# Patient Record
Sex: Male | Born: 1945 | Race: Black or African American | Hispanic: No | Marital: Married
Health system: Midwestern US, Community
[De-identification: ages and names within clinical notes are randomized; demographics above are authoritative.]

## PROBLEM LIST (undated history)

## (undated) DIAGNOSIS — K5730 Diverticulosis of large intestine without perforation or abscess without bleeding: Secondary | ICD-10-CM

## (undated) DIAGNOSIS — I4891 Unspecified atrial fibrillation: Secondary | ICD-10-CM

## (undated) DIAGNOSIS — R109 Unspecified abdominal pain: Secondary | ICD-10-CM

## (undated) DIAGNOSIS — R001 Bradycardia, unspecified: Secondary | ICD-10-CM

---

## 2009-07-26 NOTE — Progress Notes (Signed)
[facility] Vascular Lab Technologist Preliminary Report:  PVR Lower Extremity with Exercise    Pressure (mmHg) Right    Left    Brachial:  170   165  High Thigh:  >250   >250  Low Thigh:  224   220  Calf:   227   220  Ankle DPA:  193   180  Ankle PTA:  194   195  Great Toe:  167   151    Waveform:  Right   Left  CFA:   Triphasic  Triphasic  Popliteal:  Triphasic  Triphasic  PTA:   Triphasic  Triphasic  DPA:   Triphasic  Triphasic  Great Toe:  Pulsatile  Pulsatile    Right Resting ABI:   1.14   Right Post Exercise ABI: 1.23  Right Resting TBI:  0.98    Left Resting ABI:  1.15   Left Post Exercise ABI: 1.26  Left Resting TBI:  0.89    Final report to follow.    CFA = Common Femoral Artery  DPA = Dorsalis Pedis Artery  PTA = Posterior Tibial Artery  ABI = Ankle Brachial Index  TBI = Toe Brachial Index

## 2017-09-25 ENCOUNTER — Encounter

## 2017-10-02 ENCOUNTER — Inpatient Hospital Stay: Admit: 2017-10-02 | Payer: MEDICARE

## 2017-10-02 ENCOUNTER — Ambulatory Visit

## 2017-10-02 DIAGNOSIS — R109 Unspecified abdominal pain: Secondary | ICD-10-CM

## 2017-10-02 MED ORDER — SODIUM CHLORIDE 0.9 % IJ SYRG
Freq: Once | INTRAMUSCULAR | Status: AC
Start: 2017-10-02 — End: 2017-10-02
  Administered 2017-10-02: 19:00:00 via INTRAVENOUS

## 2017-10-02 MED ORDER — IOHEXOL 240 MG/ML IV SOLN
240 mg iodine/mL | Freq: Once | INTRAVENOUS | Status: AC
Start: 2017-10-02 — End: 2017-10-02
  Administered 2017-10-02: 19:00:00 via ORAL

## 2017-10-02 MED ORDER — IOPAMIDOL 76 % IV SOLN
370 mg iodine /mL (76 %) | Freq: Once | INTRAVENOUS | Status: AC
Start: 2017-10-02 — End: 2017-10-02
  Administered 2017-10-02: 19:00:00 via INTRAVENOUS

## 2017-10-02 MED FILL — ISOVUE-370  76 % INTRAVENOUS SOLUTION: 370 mg iodine /mL (76 %) | INTRAVENOUS | Qty: 100

## 2017-10-02 MED FILL — OMNIPAQUE 240 MG IODINE/ML INTRAVENOUS SOLUTION: 240 mg iodine/mL | INTRAVENOUS | Qty: 50

## 2017-10-03 ENCOUNTER — Encounter

## 2017-10-07 ENCOUNTER — Inpatient Hospital Stay: Payer: MEDICARE | Attending: Cardiovascular Disease

## 2017-10-07 DIAGNOSIS — I4891 Unspecified atrial fibrillation: Secondary | ICD-10-CM

## 2017-10-07 MED ORDER — METOPROLOL TARTRATE 25 MG TAB
25 mg | ORAL_TABLET | Freq: Two times a day (BID) | ORAL | 11 refills | Status: AC
Start: 2017-10-07 — End: ?

## 2017-10-07 NOTE — Progress Notes (Signed)
Patient arrived to Non-Invasive Cardiology Lab for Out Patient TEE and Cardioversion Procedure. Staff introduced to patient. Patient identifiers verified with Name and Date of Birth. Procedure verified with patient. Consent forms reviewed and signed by patient or authorized representative and verified. Allergies verified. Patient informed of procedure and plan of care. Questions answered with review.     Patient on cardiac monitor, in Sinus tachycardia, non-invasive blood pressure, SPO2 monitor. On room air. Patient is A&Ox3. Patient reports no complaints.    EKG obtained    Patient on stretcher, in low position, with side rails up. Patient instructed to call for assistance as needed.    Dr Shirlyn Goltz called to room to assess Rhythm    Family in waiting room.

## 2017-10-07 NOTE — Progress Notes (Signed)
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    Event monitor to be mailed to pt, pt to follow up with Dr Shirlyn Goltz in 7-8 weeks, office to contact pt to schedule. New prescription for Metoprolol sent to pts pharmacy per Dr Shirlyn Goltz

## 2017-10-08 LAB — EKG 12-LEAD
Atrial Rate: 104 {beats}/min
P Axis: -60 degrees
P-R Interval: 280 ms
Q-T Interval: 338 ms
QRS Duration: 72 ms
QTc Calculation (Bazett): 444 ms
R Axis: 57 degrees
T Axis: 34 degrees
Ventricular Rate: 104 {beats}/min

## 2017-10-08 LAB — EKG, 12 LEAD, INITIAL
Atrial Rate: 104 {beats}/min
Calculated P Axis: -60 degrees
Calculated R Axis: 57 degrees
Calculated T Axis: 34 degrees
P-R Interval: 280 ms
Q-T Interval: 338 ms
QRS Duration: 72 ms
QTC Calculation (Bezet): 444 ms
Ventricular Rate: 104 {beats}/min

## 2017-12-12 ENCOUNTER — Inpatient Hospital Stay: Payer: MEDICARE

## 2017-12-12 ENCOUNTER — Ambulatory Visit: Admit: 2017-12-12 | Payer: MEDICARE

## 2017-12-12 MED ORDER — FENTANYL CITRATE (PF) 50 MCG/ML IJ SOLN
50 mcg/mL | INTRAMUSCULAR | Status: DC | PRN
Start: 2017-12-12 — End: 2017-12-12
  Administered 2017-12-12 (×2): via INTRAVENOUS

## 2017-12-12 MED ORDER — MIDAZOLAM (PF) 1 MG/ML INJECTION SOLUTION
1 mg/mL | INTRAMUSCULAR | Status: AC
Start: 2017-12-12 — End: ?

## 2017-12-12 MED ORDER — BACITRACIN 50,000 UNIT IM
50000 unit | INTRAMUSCULAR | Status: DC | PRN
Start: 2017-12-12 — End: 2017-12-12
  Administered 2017-12-12: 15:00:00

## 2017-12-12 MED ORDER — CEPHALEXIN 500 MG CAP
500 mg | ORAL_CAPSULE | Freq: Four times a day (QID) | ORAL | 0 refills | Status: AC
Start: 2017-12-12 — End: 2017-12-15

## 2017-12-12 MED ORDER — MIDAZOLAM 1 MG/ML IJ SOLN
1 mg/mL | INTRAMUSCULAR | Status: DC | PRN
Start: 2017-12-12 — End: 2017-12-12
  Administered 2017-12-12 (×3): via INTRAVENOUS

## 2017-12-12 MED ORDER — CEFAZOLIN 1 GRAM SOLUTION FOR INJECTION
1 gram | INTRAMUSCULAR | Status: AC
Start: 2017-12-12 — End: ?

## 2017-12-12 MED ORDER — LIDOCAINE-EPINEPHRINE 1 %-1:100,000 IJ SOLN
1 %-:00,000 | INTRAMUSCULAR | Status: DC | PRN
Start: 2017-12-12 — End: 2017-12-12
  Administered 2017-12-12: 15:00:00 via INTRADERMAL

## 2017-12-12 MED ORDER — CEFAZOLIN 2 GRAM/20 ML IN STERILE WATER INTRAVENOUS SYRINGE
2 gram/0 mL | INTRAVENOUS | Status: AC
Start: 2017-12-12 — End: ?

## 2017-12-12 MED ORDER — FENTANYL CITRATE (PF) 50 MCG/ML IJ SOLN
50 mcg/mL | INTRAMUSCULAR | Status: AC
Start: 2017-12-12 — End: ?

## 2017-12-12 MED ORDER — IOPAMIDOL 76 % IV SOLN
370 mg iodine /mL (76 %) | INTRAVENOUS | Status: DC | PRN
Start: 2017-12-12 — End: 2017-12-12
  Administered 2017-12-12: 15:00:00 via INTRAVENOUS

## 2017-12-12 MED ORDER — CEFAZOLIN 2 GRAM/20 ML IN STERILE WATER INTRAVENOUS SYRINGE
2 gram/0 mL | INTRAVENOUS | Status: DC | PRN
Start: 2017-12-12 — End: 2017-12-12
  Administered 2017-12-12: 15:00:00 via INTRAVENOUS

## 2017-12-12 MED ORDER — HEPARIN (PORCINE) IN NS (PF) 1,000 UNIT/500 ML IV
1000 unit/500 mL | INTRAVENOUS | Status: AC
Start: 2017-12-12 — End: ?

## 2017-12-12 MED ORDER — MIDAZOLAM 1 MG/ML IJ SOLN
1 mg/mL | INTRAMUSCULAR | Status: DC | PRN
Start: 2017-12-12 — End: 2017-12-12
  Administered 2017-12-12: 15:00:00 via INTRAVENOUS

## 2017-12-12 MED ORDER — LIDOCAINE-EPINEPHRINE 1 %-1:100,000 IJ SOLN
1 %-:00,000 | INTRAMUSCULAR | Status: AC
Start: 2017-12-12 — End: ?

## 2017-12-12 MED ORDER — HEPARIN (PORCINE) IN NS (PF) 1,000 UNIT/500 ML IV
1000 unit/500 mL | INTRAVENOUS | Status: AC | PRN
Start: 2017-12-12 — End: 2017-12-12
  Administered 2017-12-12: 15:00:00

## 2017-12-12 MED ORDER — BACITRACIN 50,000 UNIT IM
50000 unit | INTRAMUSCULAR | Status: AC
Start: 2017-12-12 — End: ?

## 2017-12-12 MED ORDER — MIDAZOLAM 1 MG/ML IJ SOLN
1 mg/mL | INTRAMUSCULAR | Status: AC
Start: 2017-12-12 — End: ?

## 2017-12-12 MED FILL — BACITRACIN 50,000 UNIT IM: 50000 unit | INTRAMUSCULAR | Qty: 50000

## 2017-12-12 MED FILL — FENTANYL CITRATE (PF) 50 MCG/ML IJ SOLN: 50 mcg/mL | INTRAMUSCULAR | Qty: 2

## 2017-12-12 MED FILL — XYLOCAINE WITH EPINEPHRINE 1 %-1:100,000 INJECTION SOLUTION: 1 %-:00,000 | INTRAMUSCULAR | Qty: 40

## 2017-12-12 MED FILL — HEPARIN (PORCINE) IN NS (PF) 1,000 UNIT/500 ML IV: 1000 unit/500 mL | INTRAVENOUS | Qty: 500

## 2017-12-12 MED FILL — MIDAZOLAM (PF) 1 MG/ML INJECTION SOLUTION: 1 mg/mL | INTRAMUSCULAR | Qty: 2

## 2017-12-12 MED FILL — CEFAZOLIN 1 GRAM SOLUTION FOR INJECTION: 1 gram | INTRAMUSCULAR | Qty: 1000

## 2017-12-12 MED FILL — CEFAZOLIN 2 GRAM/20 ML IN STERILE WATER INTRAVENOUS SYRINGE: 2 gram/0 mL | INTRAVENOUS | Qty: 20

## 2017-12-12 MED FILL — MIDAZOLAM 1 MG/ML IJ SOLN: 1 mg/mL | INTRAMUSCULAR | Qty: 5

## 2017-12-12 NOTE — Progress Notes (Signed)
 TRANSFER - IN REPORT:    Verbal report received from [facility]) on [employer].  being received from Cath lab recovery(unit) for routine progression of care      Report consisted of patient's Situation, Background, Assessment and   Recommendations(SBAR).     Information from the following report(s) SBAR and Kardex was reviewed with the receiving nurse.    Opportunity for questions and clarification was provided.      Assessment completed upon patient's arrival to unit and care assumed.

## 2017-12-12 NOTE — Progress Notes (Signed)
Stable post pacer. Will d/c home. D/C on prior meds plus metoprolol ,  Keflex    See us 2 weeks for pacer check     Resume Eliquis Sunday

## 2017-12-12 NOTE — Progress Notes (Signed)
TRANSFER - IN REPORT:    Verbal report received from Osker Mason, RN on [employer].  being received from EP Lab for routine progression of care. Report consisted of patient's Situation, Background, Assessment and Recommendations(SBAR). Information from the following report(s) SBAR, Kardex, Procedure Summary, MAR and Recent Results was reviewed with the receiving clinician. Opportunity for questions and clarification was provided. Assessment completed upon patient's arrival to Cardiac Cath Lab RECOVERY AREA and care assumed.       Cardiac Cath Lab Recovery Arrival Note:    [name] [name]. arrived to CCL recovery area.  Patient procedure= PPI. Patient on cardiac monitor, non-invasive blood pressure, SPO2 monitor. On ROOM AIRPatient status doing well without problems. Patient is A&Ox 4. Patient reports no complaints.    PROCEDURE SITE CHECK:    Procedure site:without any bleeding and tefla tegaderm dressing, No pain/discomfort reported at procedure site.     No change in patient status. Continue to monitor patient and status.

## 2017-12-12 NOTE — Progress Notes (Signed)
TRANSFER - IN REPORT:    Verbal report received from Barbara Covington, RN on [name] Jungman Jr.  being received from EP Lab for routine progression of care. Report consisted of patient???s Situation, Background, Assessment and Recommendations(SBAR). Information from the following report(s) SBAR, Kardex, Procedure Summary, MAR and Recent Results was reviewed with the receiving clinician. Opportunity for questions and clarification was provided. Assessment completed upon patient???s arrival to Cardiac Cath Lab RECOVERY AREA and care assumed.       Cardiac Cath Lab Recovery Arrival Note:    [name] Twombly Jr. arrived to CCL recovery area.  Patient procedure= PPI. Patient on cardiac monitor, non-invasive blood pressure, SPO2 monitor. On ROOM AIRPatient status doing well without problems. Patient is A&Ox 4. Patient reports no complaints.    PROCEDURE SITE CHECK:    Procedure site:without any bleeding and tefla tegaderm dressing, No pain/discomfort reported at procedure site.     No change in patient status. Continue to monitor patient and status.

## 2017-12-12 NOTE — Progress Notes (Signed)
Spoke with Dr. Holdaway regarding patient's discharge and he states he will be at the office for 45 mins to an hour longer; MD states to transfer patient to IVCU until discharge. Report given to Terry RN and patient ambulated to room 2153.

## 2017-12-12 NOTE — Progress Notes (Signed)
Discharge instructions reviewed with patient and spouse. Allowed adequate time to ask questions, all questions answered. Printed copy of AVS given to patient. All belongings gathered, IV and tele discontinued. Transported via wheelchair to main entrance and into care of family.

## 2017-12-12 NOTE — Progress Notes (Signed)
TRANSFER - IN REPORT:    Verbal report received from Susan RN(name) on Foch Hatcher Jr.  being received from Cath lab recovery(unit) for routine progression of care      Report consisted of patient???s Situation, Background, Assessment and   Recommendations(SBAR).     Information from the following report(s) SBAR and Kardex was reviewed with the receiving nurse.    Opportunity for questions and clarification was provided.      Assessment completed upon patient???s arrival to unit and care assumed.

## 2017-12-12 NOTE — Progress Notes (Signed)
Cardiac Cath Lab Recovery Arrival Note:      [name] Lanagan Jr. arrived to Cardiac Cath Lab, Recovery Area. Staff introduced to patient. Patient identifiers verified with NAME and DATE OF BIRTH. Procedure verified with patient. Consent forms reviewed and signed by patient or authorized representative and verified. Allergies verified.     Patient and family oriented to department. Patient and family informed of procedure and plan of care.     Questions answered with review. Patient prepped for procedure, per orders from physician, prior to arrival.    Patient on cardiac monitor, non-invasive blood pressure, SPO2 monitor. Patient is A&Ox 4. Patient reports no complaints.     Patient in stretcher, in low position, with side rails up, call bell within reach, patient instructed to call if assistance as needed.    Patient prep in: CCL Recovery Area, [facility].       Prep by: Julie Clark, RN and Susan Fincher, RN

## 2017-12-12 NOTE — Progress Notes (Addendum)
Stable post pacer. Will d/c home. D/C on prior meds plus metoprolol ,  Keflex    See us 2 weeks for pacer check     Resume Eliquis Sunday

## 2017-12-12 NOTE — Progress Notes (Signed)
Dual chamber pacer placed via left subclavian s complications  Operator Lewie Deman    Full report to follow.

## 2017-12-12 NOTE — Progress Notes (Signed)
Spoke with Dr. Irma NewnessHoldaway regarding patient's discharge and he states he will be at the office for 45 mins to an hour longer; [facility]. Report given to [facility] RN and patient ambulated to room 2153.

## 2017-12-12 NOTE — Progress Notes (Signed)
Cardiac Cath Lab Recovery Arrival Note:      [name] [name]. arrived to Cardiac Cath Lab, Recovery Area. Staff introduced to patient. Patient identifiers verified with NAME and DATE OF BIRTH. Procedure verified with patient. Consent forms reviewed and signed by patient or authorized representative and verified. Allergies verified.     Patient and family oriented to department. Patient and family informed of procedure and plan of care.     Questions answered with review. Patient prepped for procedure, per orders from physician, prior to arrival.    Patient on cardiac monitor, non-invasive blood pressure, SPO2 monitor. Patient is A&Ox 4. Patient reports no complaints.     Patient in stretcher, in low position, with side rails up, call bell within reach, patient instructed to call if assistance as needed.    Patient prep in: CCL Recovery Area, [facility].       Prep by: Aura Dials, RN and Christen Butter, RN

## 2017-12-12 NOTE — Progress Notes (Signed)
Discharge instructions reviewed with patient and spouse. Allowed adequate time to ask questions, all questions answered. Printed copy of AVS given to patient. All belongings gathered, IV and tele discontinued. Transported via wheelchair to main entrance and into care of family.

## 2017-12-12 NOTE — Progress Notes (Signed)
Dual chamber pacer placed via left subclavian s complications  Operator Olivea Sonnen    Full report to follow.

## 2018-05-18 IMAGING — CT CT CHEST PULMONARY EMBOLISM W IV CONTRAST
2 of 5 series · 13 of 36 positions shown · non-contrast
Comparison: CT 02/17/2018

CT CHEST PULMONARY EMBOLISM W IV CONTRAST
INDICATION: PE suspected, intermediate prob, positive D-dimer
TECHNIQUE: Following informed consent IV contrast administered and axial images obtained followed by MIP reformatted images

[Series 2: pe chest · axial · 0.84mm/px · z∈[-300,-25]mm · 10 of 136 slices shown]
[im 13/136  lung]
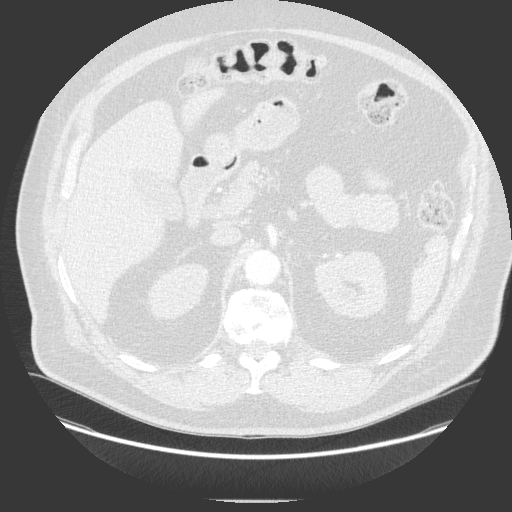
[im 25/136  mediastinal]
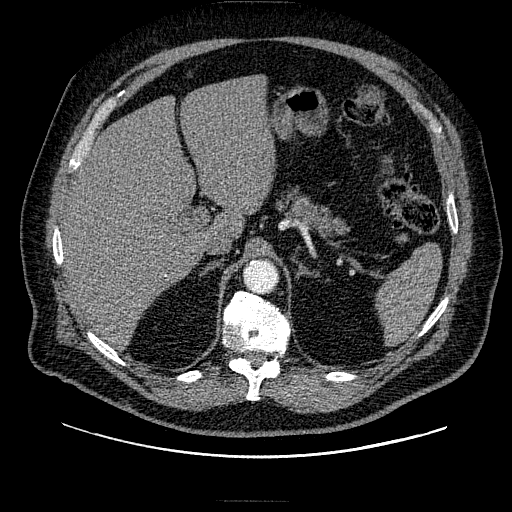
[im 37/136  lung]
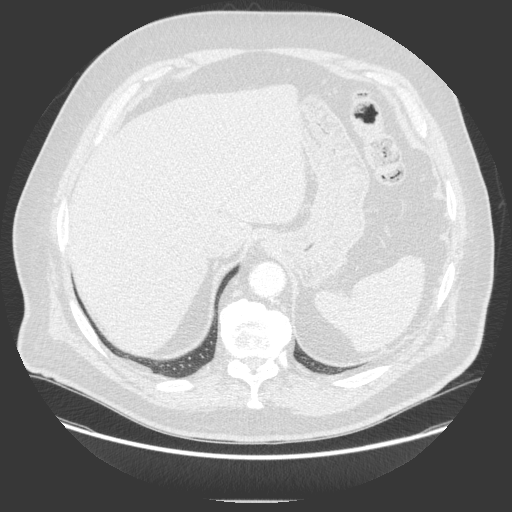
[im 50/136  mediastinal]
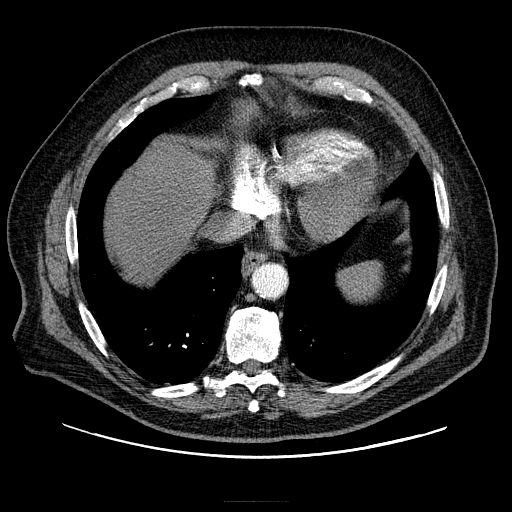
[im 62/136  lung]
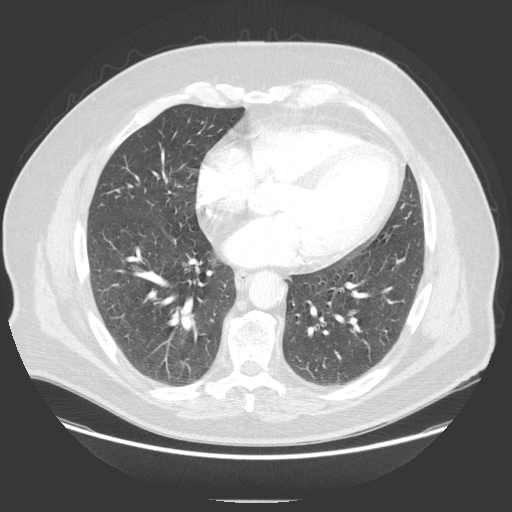
[im 74/136  mediastinal]
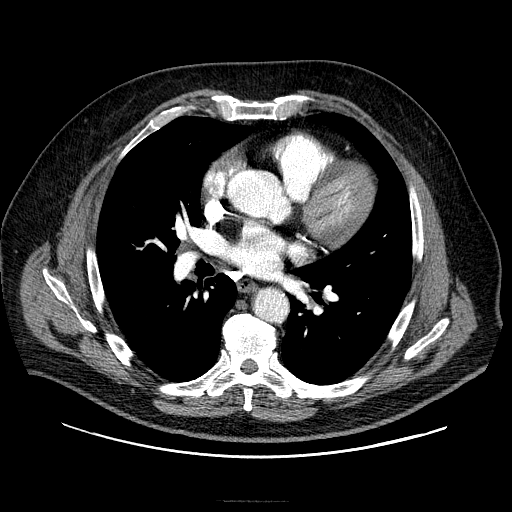
[im 86/136  lung]
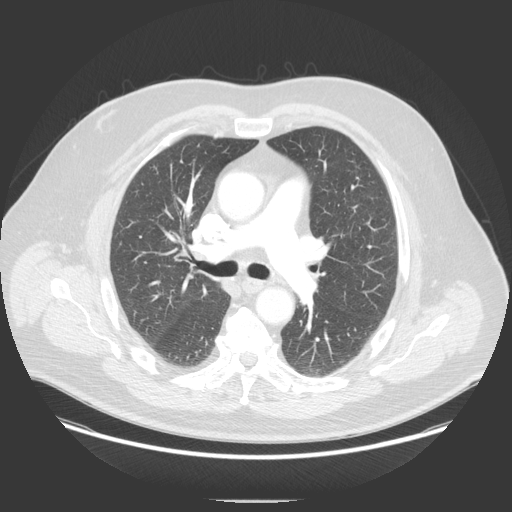
[im 99/136  mediastinal]
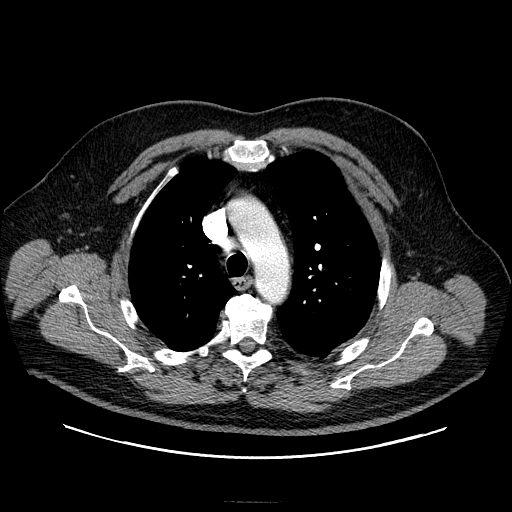
[im 111/136  lung]
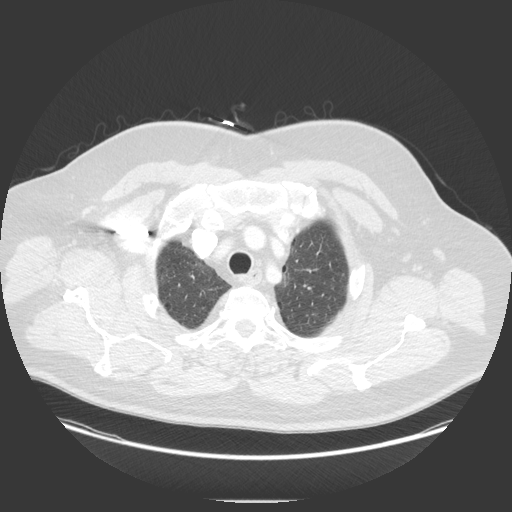
[im 123/136  mediastinal]
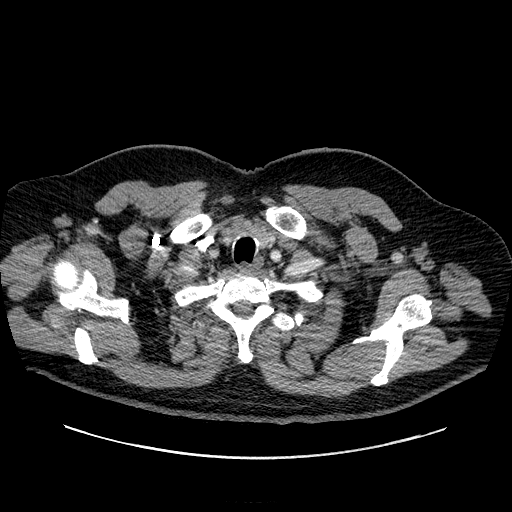

[Series 601: sag standard 2x2 · sagittal · 0.84mm/px · 3 of 215 slices shown]
[im 43/215  mediastinal]
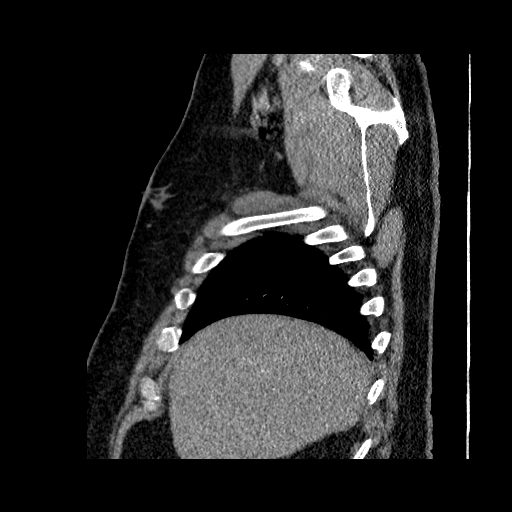
[im 86/215  mediastinal]
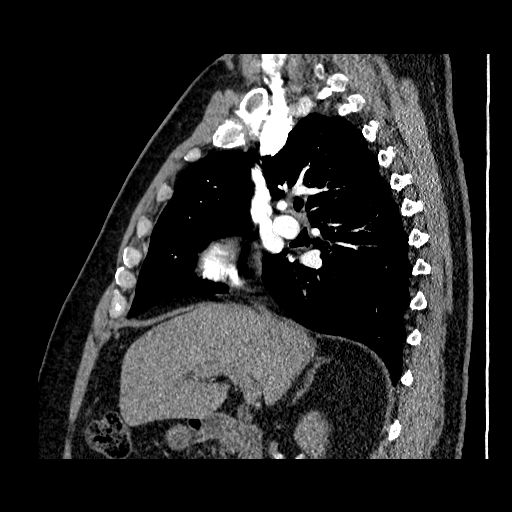
[im 129/215  mediastinal]
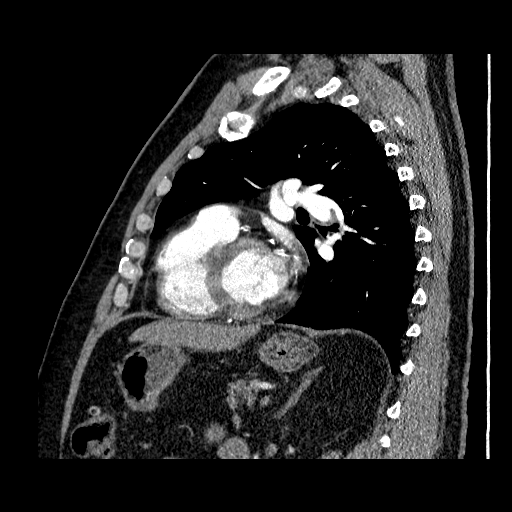

[13 of 36 positions shown; findings below may reference images not displayed]

FINDINGS: Lower neck structures in thoracic inlet are unremarkable. The anterior chest wall and axillary regions are unremarkable. Superior mediastinum demonstrates normal enhancement of the aortic arch takeoffs of the major vessels without aneurysm mass or adenopathy. Central mediastinum is unremarkable. The hilar regions demonstrates no significant mass or adenopathy. Proximal tracheobronchial tree is normal. Cardiac chambers and pericardium are normal.
The upper abdomen demonstrates no significant abnormality.
Lung windows demonstrates no infiltrate mass or pleural fluid.
Sagittal reformatted images demonstrates spondylosis midthoracic spine anteriorly without bony lytic or destructive change.
Evaluation of pulmonary arteries and MIP reformatted images demonstrates no evidence of pulmonary emboli
IMPRESSION: 
IMPRESSION: No evidence of pulmonary emboli. No acute intrathoracic abnormality
Location: 1

## 2018-05-18 IMAGING — CR XR CHEST 1 VIEW
1 series · 2 of 2 positions shown · non-contrast
Comparison: Chest x-ray 09/25/2017

XR CHEST 1 VIEW
INDICATION: chest pain
TECHNIQUE: Single view

[Series 9562: AP · 2 of 2 slices shown]
[im 1/2]
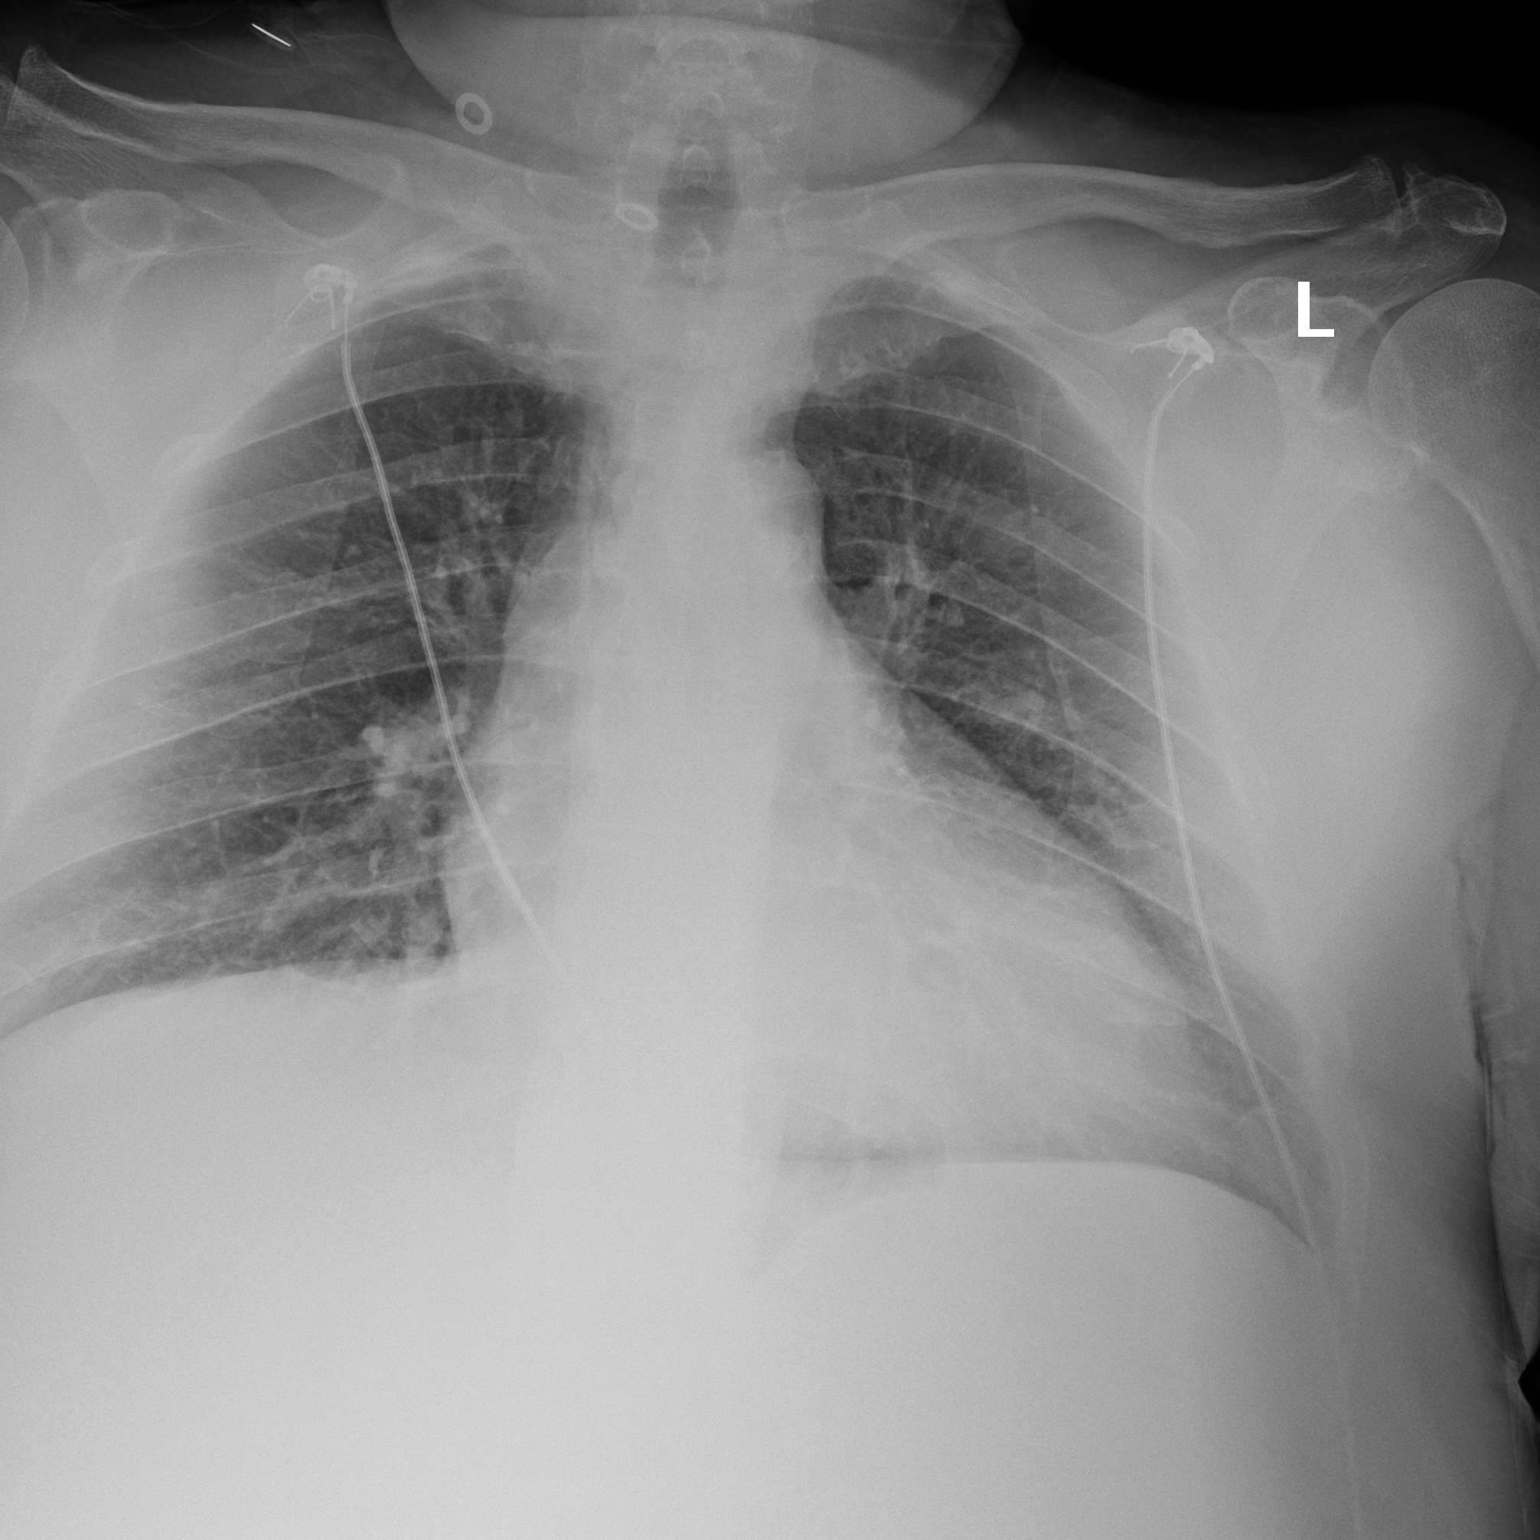
[im 2/2]
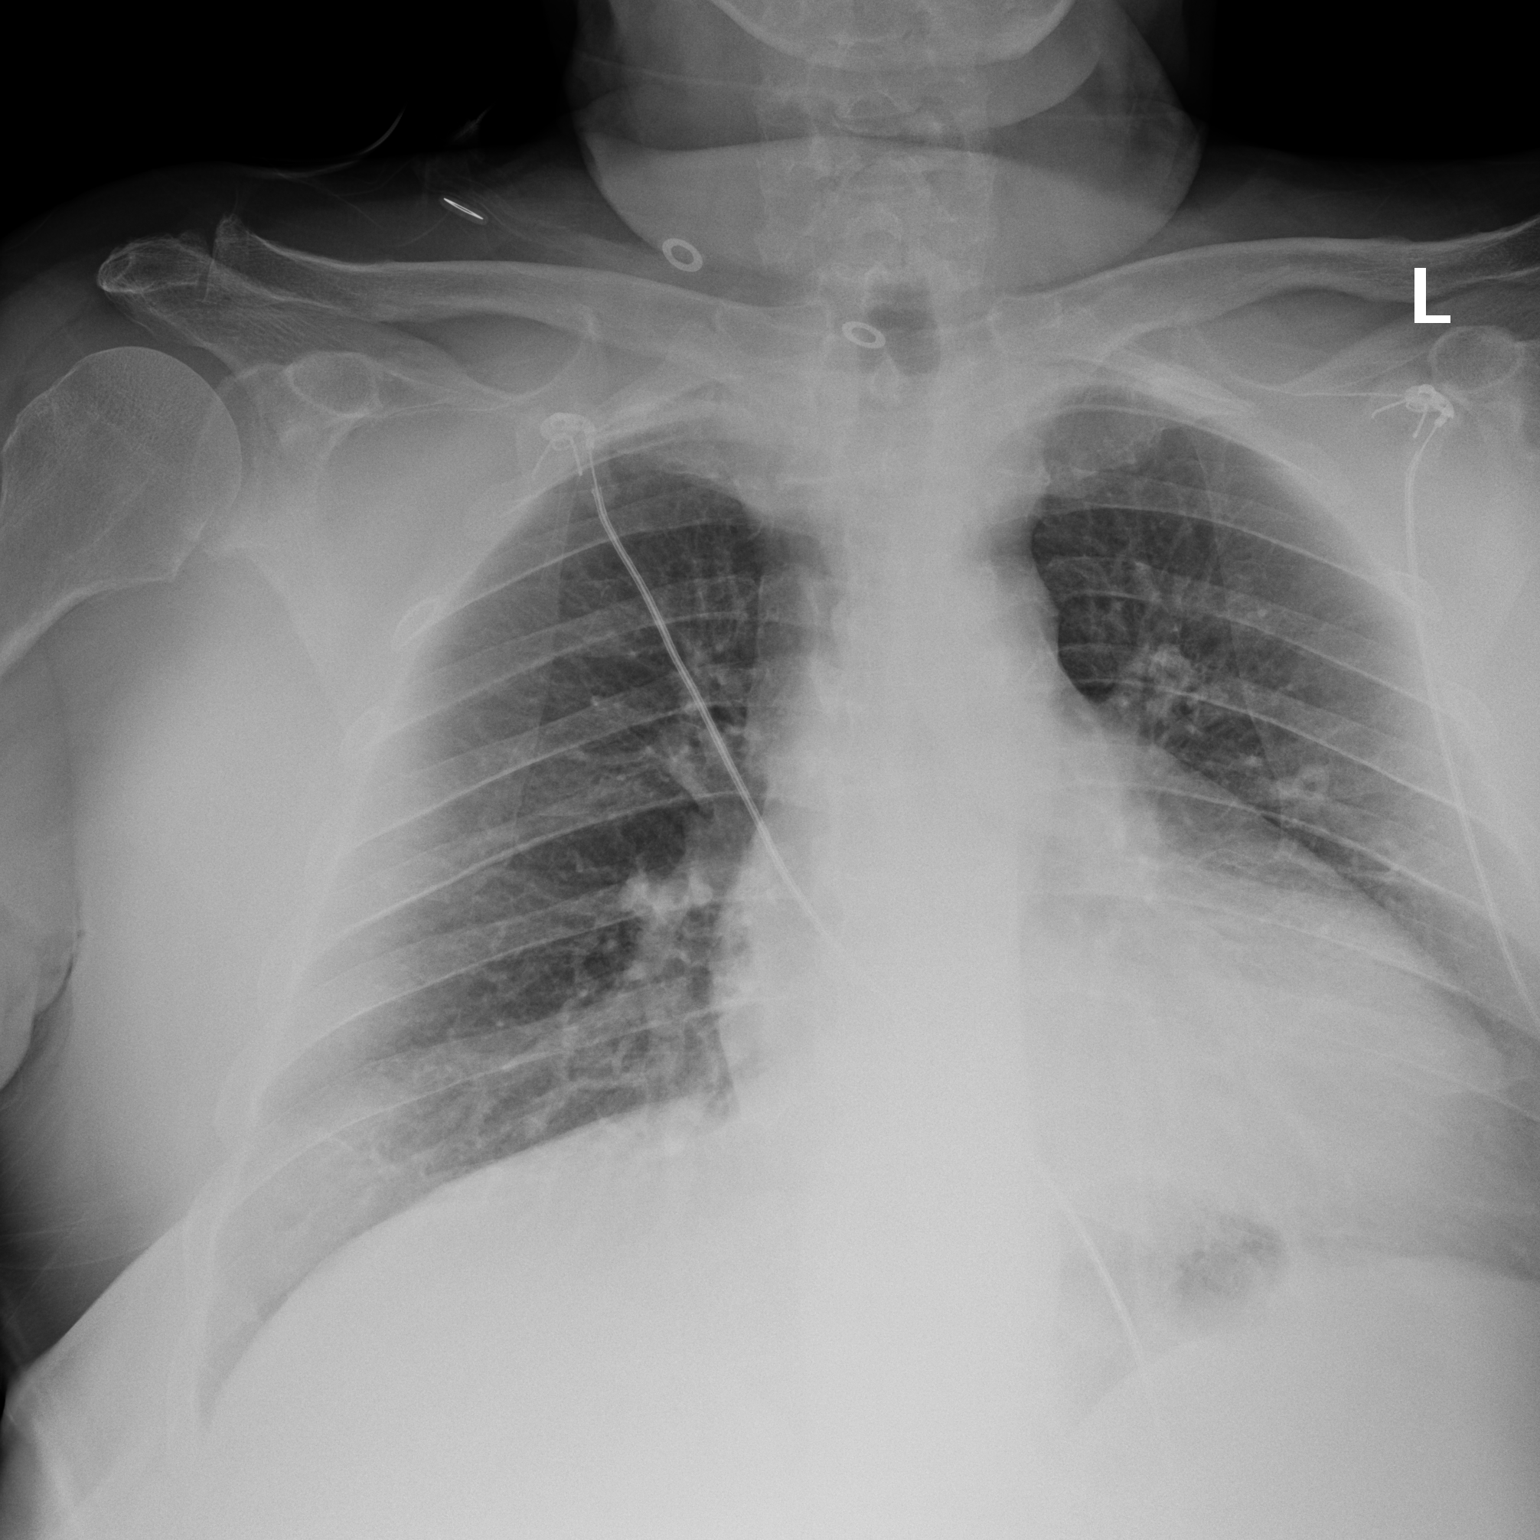

[2 of 2 positions shown; findings below may reference images not displayed]

FINDINGS: Cardiac silhouette and mediastinal structures lung fields are clear. Bony thorax unremarkable
IMPRESSION: 
IMPRESSION: No acute parenchymal disease
Location: 1

## 2018-06-04 IMAGING — CR Chest
3 series · 3 of 3 positions shown · non-contrast
Comparison: none

S.O.B AND COUGH
TWO VIEW CHEST:
CLINICAL INDICATION: cough
REFERENCE:

[PA]
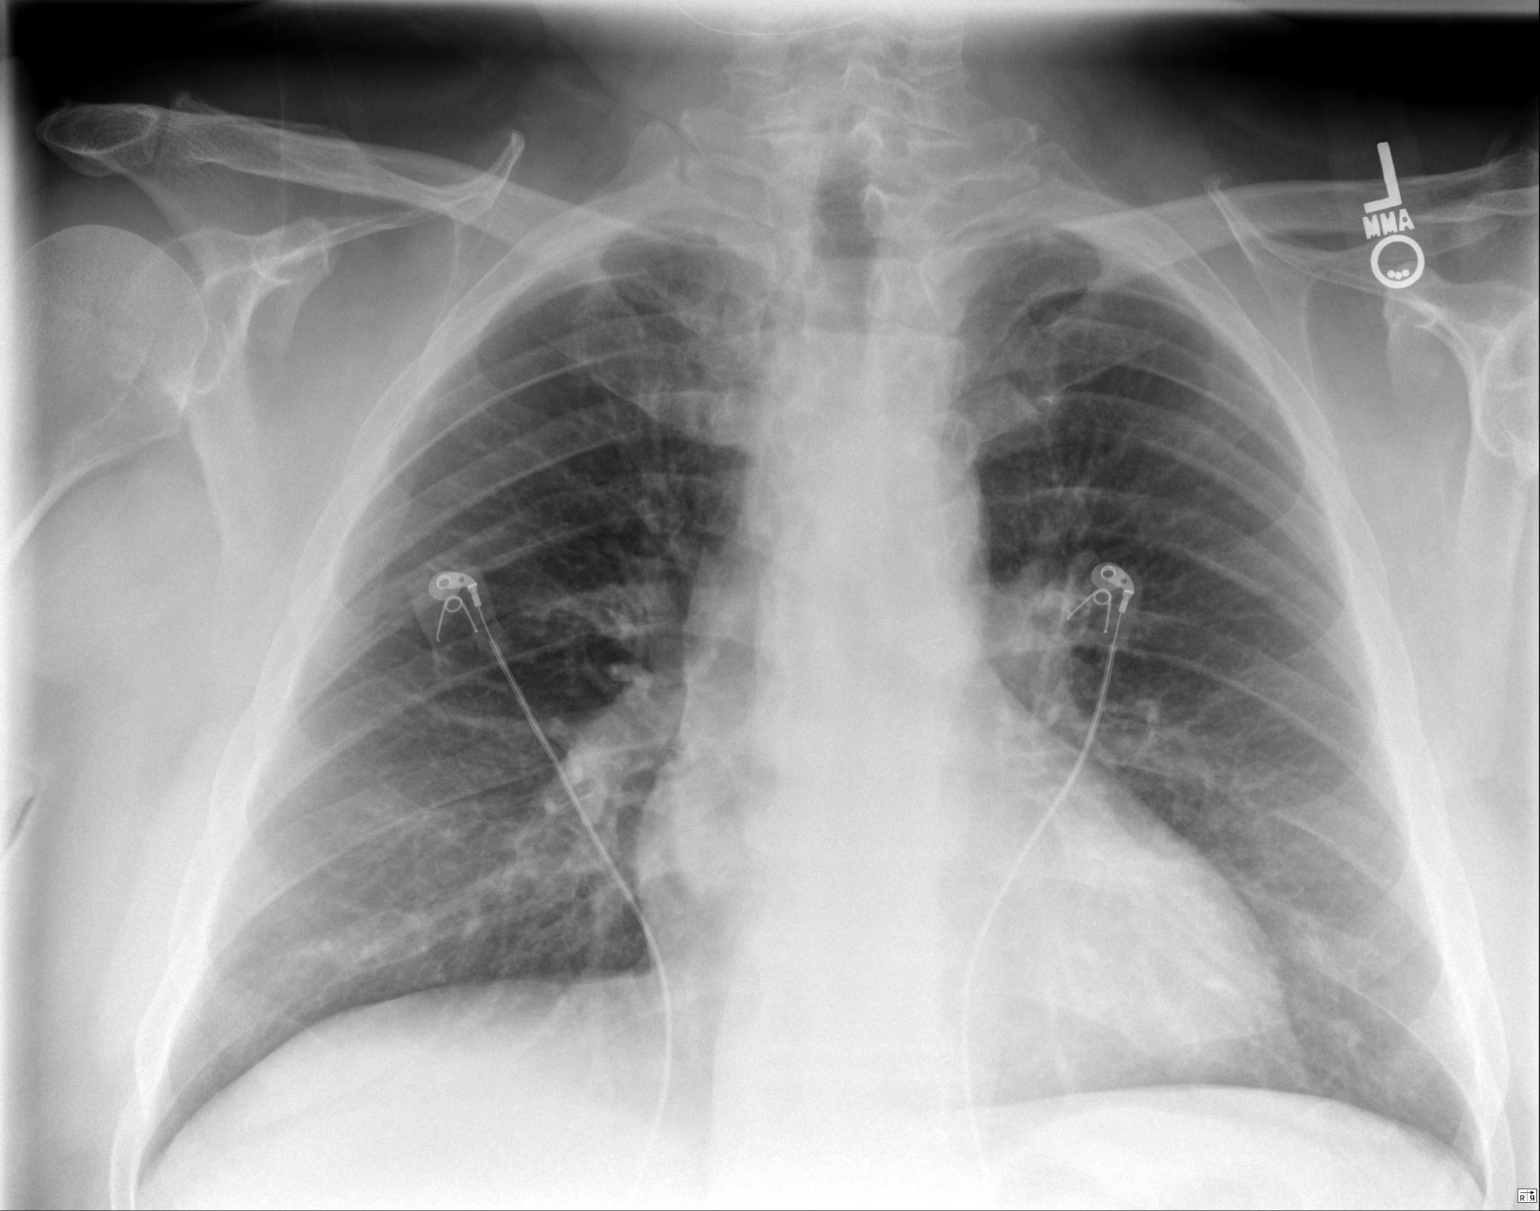

[left lateral (1 of 2)]
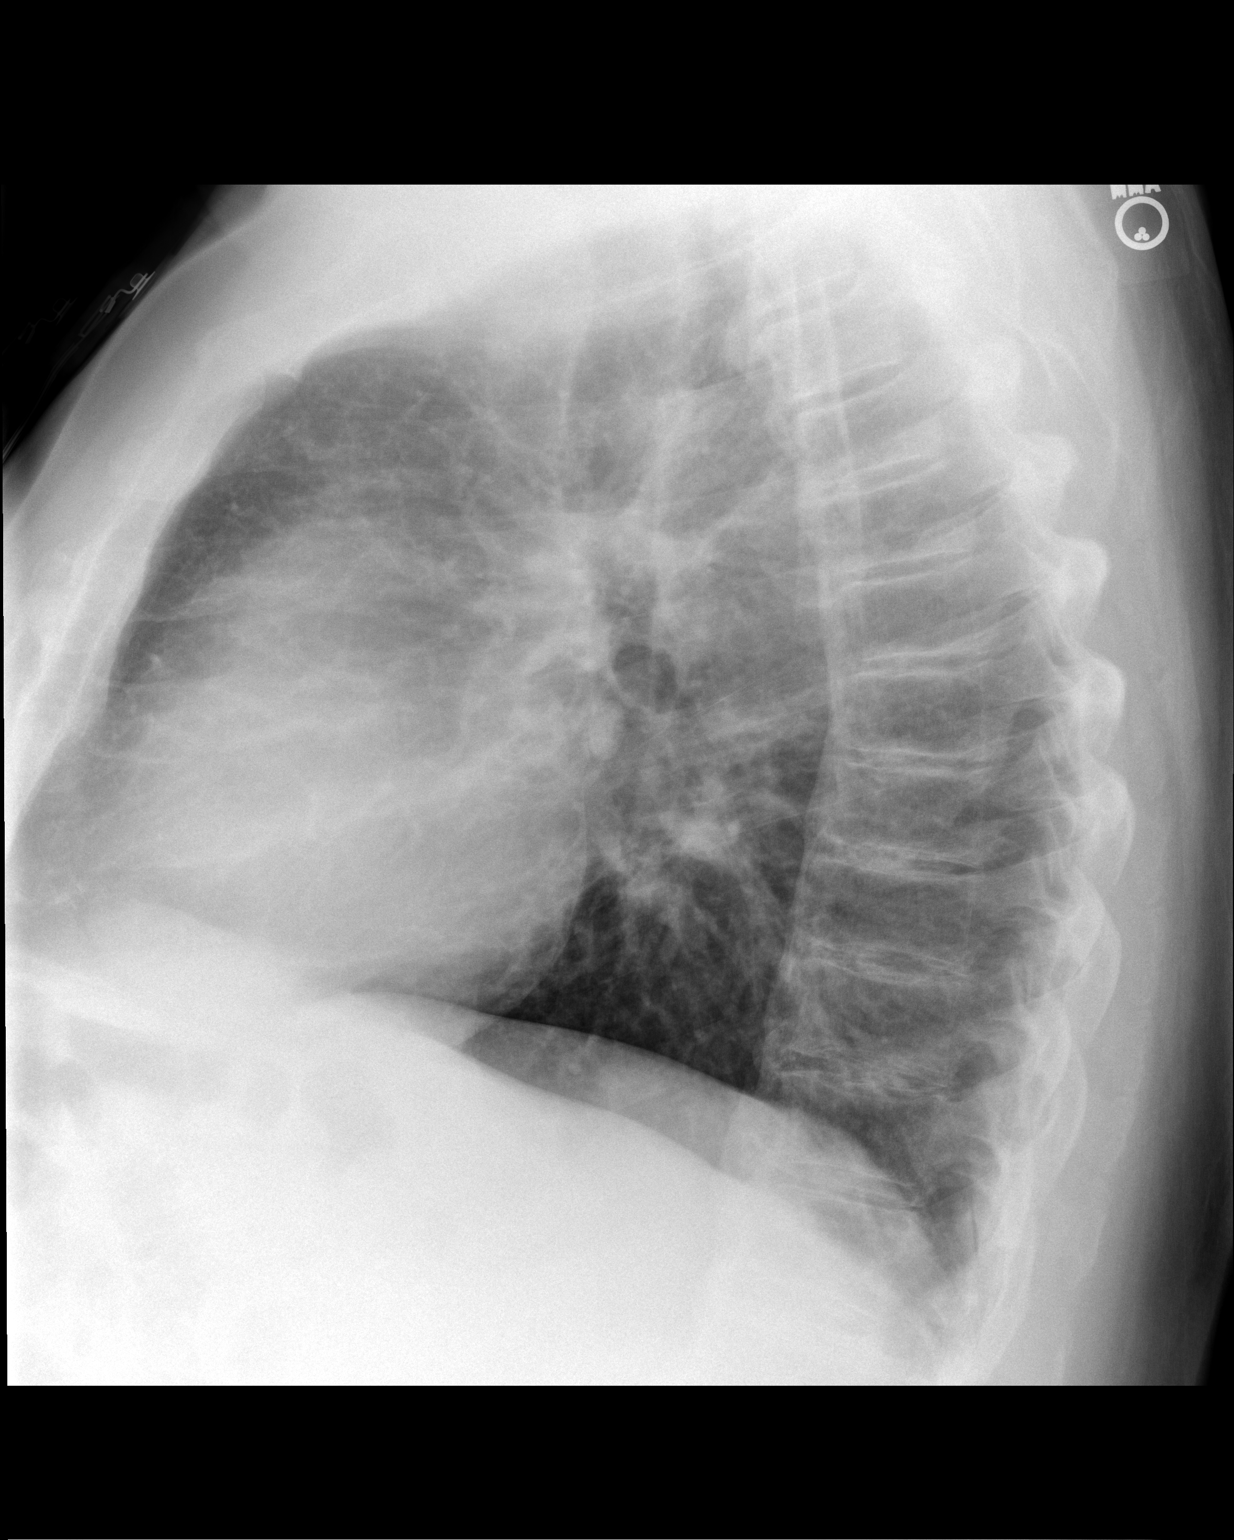

[left lateral (2 of 2)]
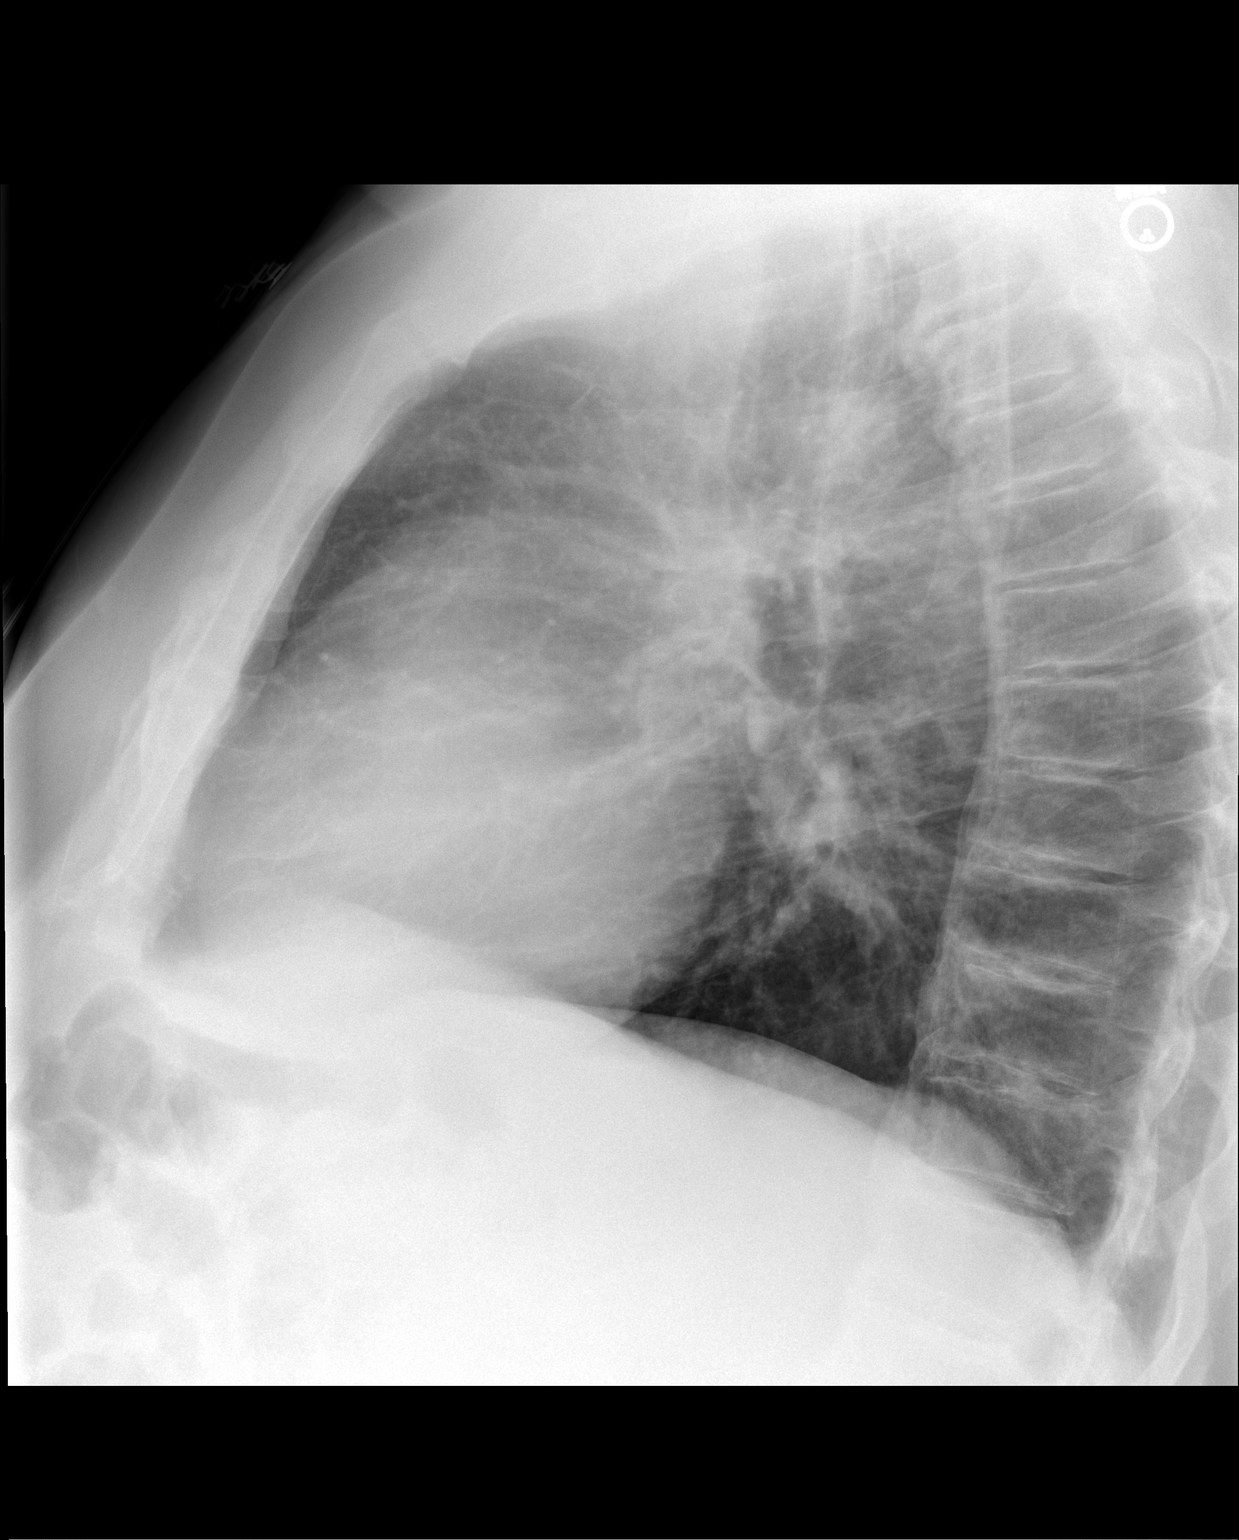

[3 of 3 positions shown; findings below may reference images not displayed]

FINDINGS: PA and lateral radiographs of the chest demonstrate:
Normal cardiomediastinal contours.
There is moderate pulmonary hyperinflation suggesting COPD.
Heart size appears normal .
The lungs are normally inflated and demonstrate no mass, infiltrate, or effusion.
Surrounding osseous and soft tissue structures are unremarkable.
IMPRESSION: No acute cardiopulmonary process identified.
LOCATION CODE: 1

## 2018-06-09 IMAGING — CR XR CHEST 1 VIEW
1 series · 1 of 1 positions shown · non-contrast
Comparison: none

Chest discomfort, sob
SINGLE PORTABLE CHEST:
CLINICAL INDICATION: Chest Discomfort
REFERENCE: 06/04/2018

[AP]
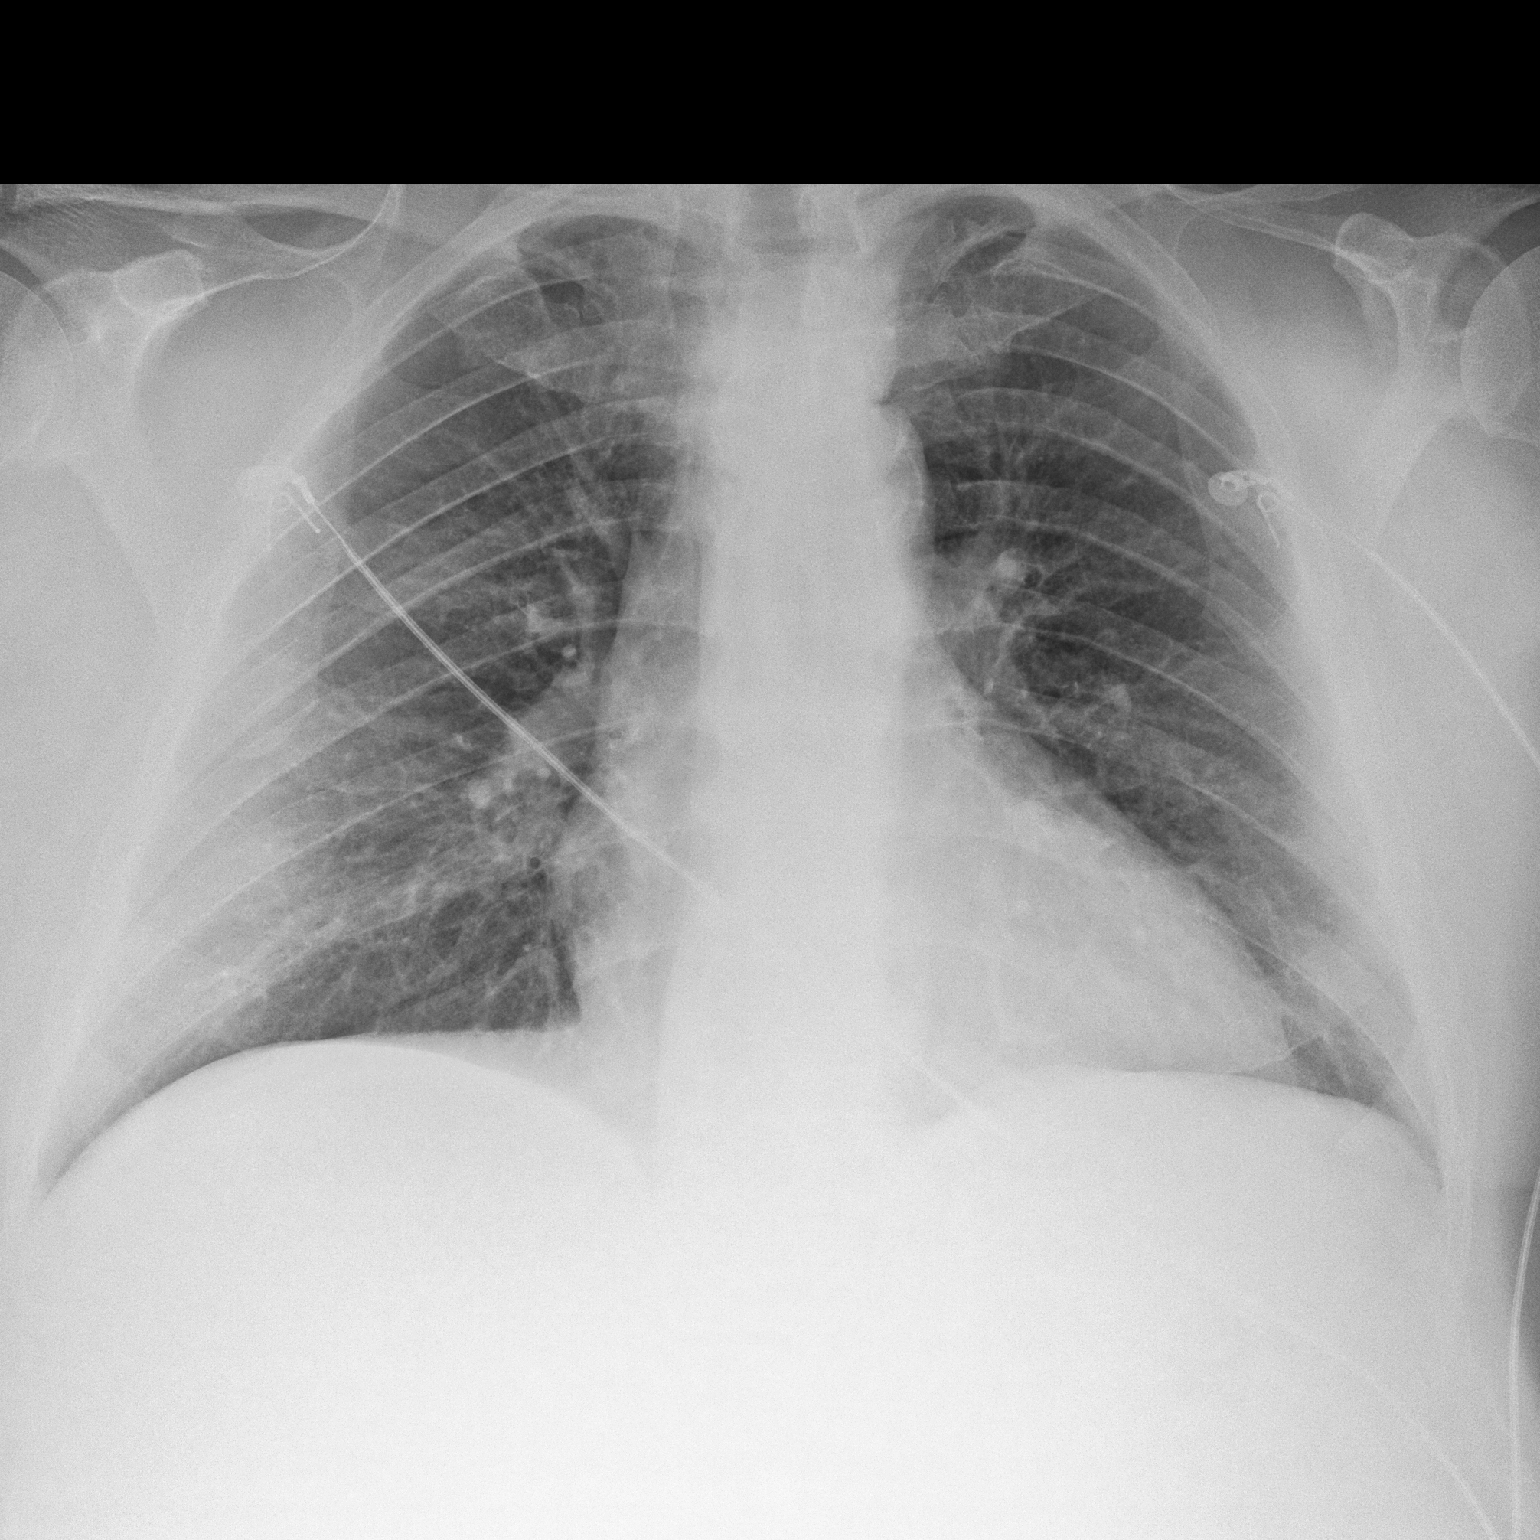

[1 of 1 positions shown; findings below may reference images not displayed]

FINDINGS: The lungs are fully expanded and clear with no consolidation, pleural effusion, pneumothorax or mass.
The heart is normal in size. The pulmonary vascularity is normal. There are no visible mediastinal masses.
There is mild degenerative change of the spine, stable.
IMPRESSION: No acute cardiopulmonary disease.
LOCATION CODE: 1

## 2018-06-10 IMAGING — CT PET^CARDIAC_RB_REST_STRESS (Adult)
2 series · 16 of 20 positions shown, 18 images · non-contrast
Comparison: none

[facility]
MYOCARDIAL PERFUSION IMAGING REPORT
Patient DOB: 03/30/1946  Patient Age: 72  Patient Gender: Male
Date of Procedure:              06/10/2018
Type of Procedure:              [id number] PET
Indications:
Chest Pain
Impressions:
Normals
* Normal rest and stress myocardial perfusion images revealing no evidence of ischemia or
infarct.
* There were no wall motion abnormalities.
* Normal ejection fraction during rest and stress.
Stress EKG Interpretation
HR Response To Stress: PET LEXISCAN
BP Response To Stress: PET LEXISCAN
Arrhythmias During Exercise: PVCs-ISOLATED
ST Changes: No significant change from baseline
TWA
Chest Pain: none
Functional Capacity: PET LEXISCAN
Overall Impressions: NUCLEAR IMAGES PENDING
Test Terminated Due To: PET LEXISCAN
Stress Information:
PROTOCOL: PET LEXISCAN
Room No.: [id number]
Test Indication(s):  Chest Discomfort
Resting ECG Interpretation: DINGLI, LORELAIE. NSTWA, PRWP
Total Stress Time (min): 3
Predicted Max HR: 149
Max BPs/BPd: 174
Actual Max HR: 87
METS Reached: 1
% MPHR Achieved: 58
Procedure:
After explanation of the procedure, the patient was placed on the imaging
table.  The patient was injected with 28.4 mCi of rubidium44 intravenously.
Gated resting images were immediately obtained. The patient was then stressed
using a chemical protocol.  Stress gated images were then immediately
performed after the intravenous injection of 28.3 mCi rubidium44. The data was
reconstructed in the short, horizontal long and vertical long axis views, as
well as tomographic slices for review.

[Series 2: ac ct rest · axial · 1.52mm/px · z∈[-481,-355]mm · 8 of 83 slices shown, 10 images]
[im 10/83  vessel]
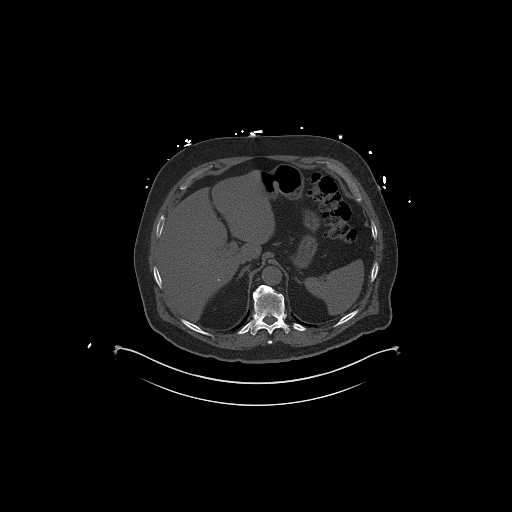
[im 10/83  lung]
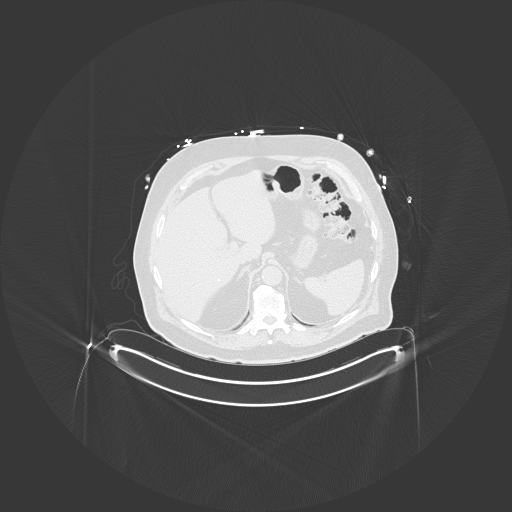
[im 19/83  vessel]
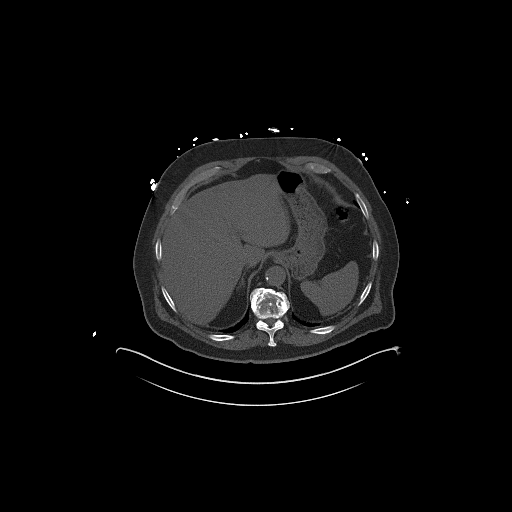
[im 28/83  vessel]
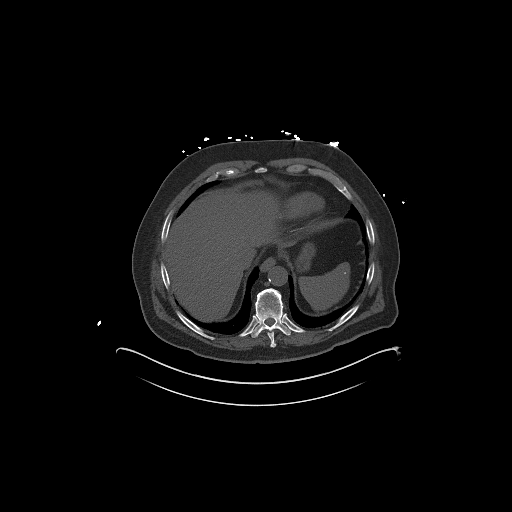
[im 37/83  vessel]
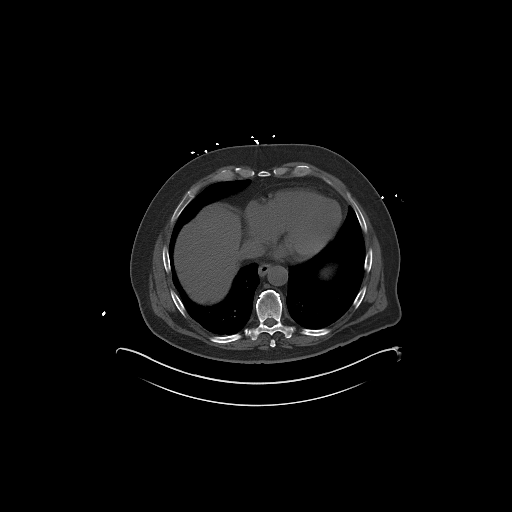
[im 46/83  vessel]
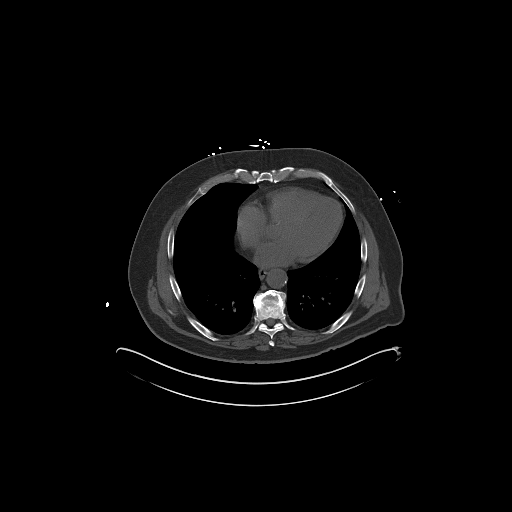
[im 46/83  lung]
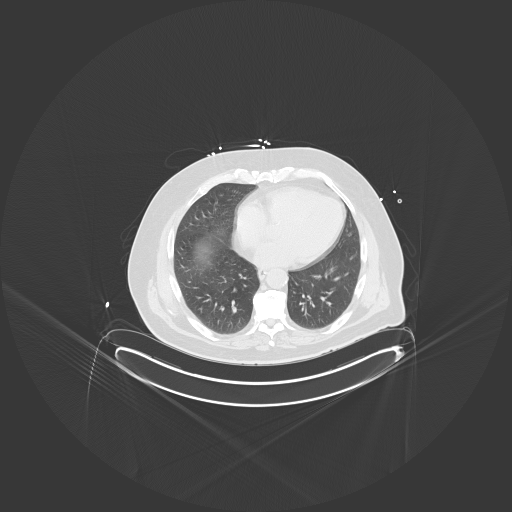
[im 55/83  vessel]
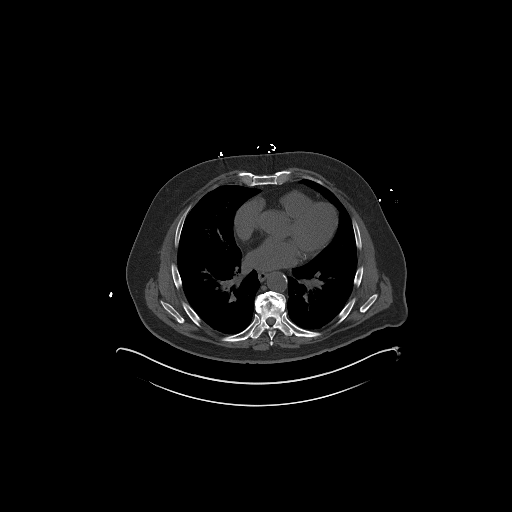
[im 64/83  vessel]
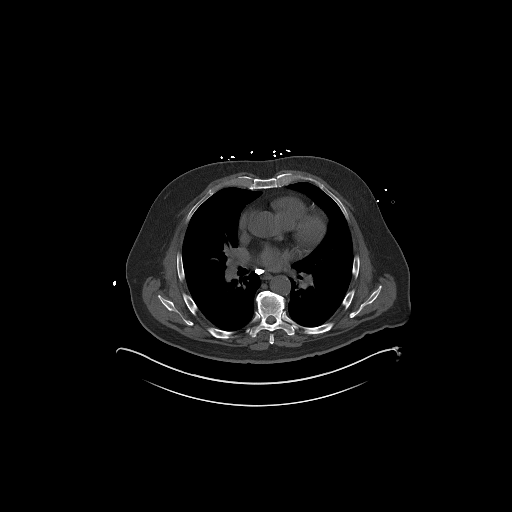
[im 73/83  vessel]
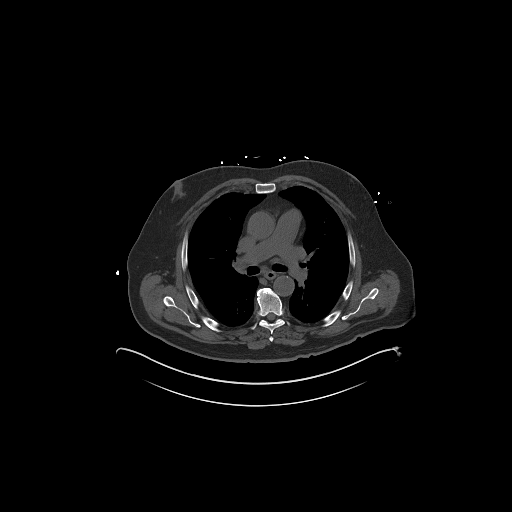

[Series 8: ac ct stress · axial · 1.52mm/px · z∈[-481,-355]mm · 8 of 83 slices shown]
[im 10/83  vessel]
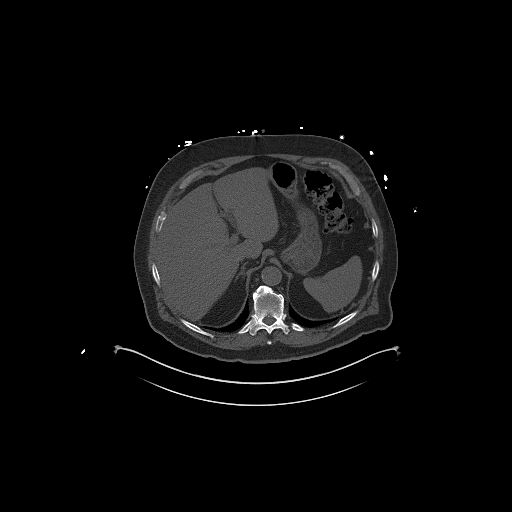
[im 19/83  vessel]
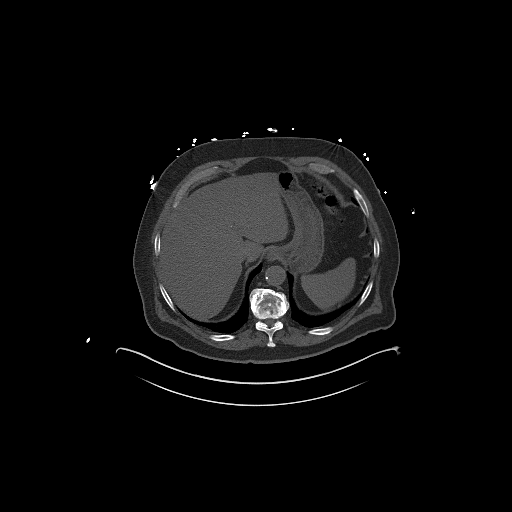
[im 28/83  vessel]
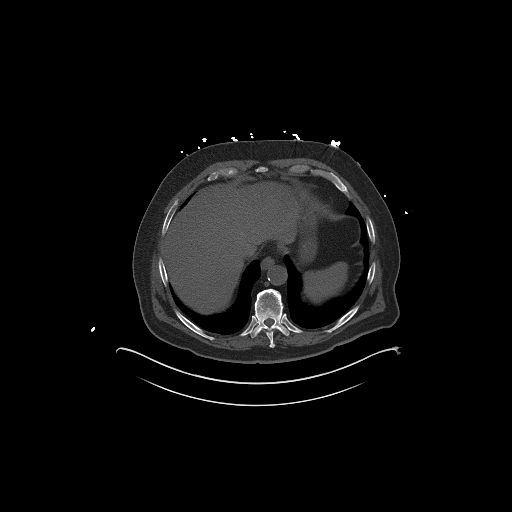
[im 37/83  vessel]
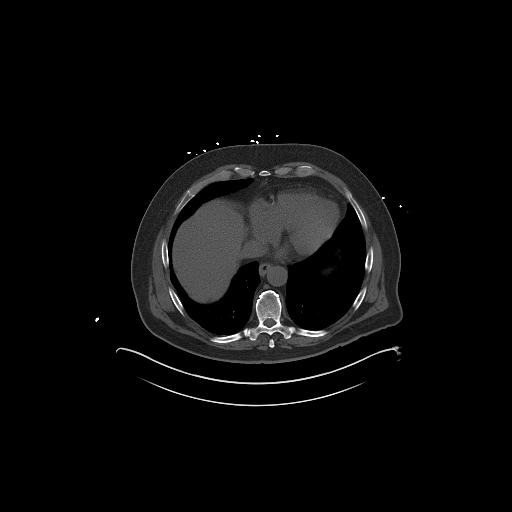
[im 46/83  vessel]
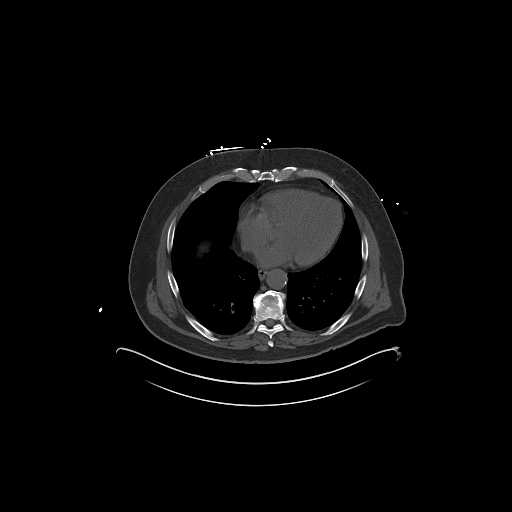
[im 55/83  vessel]
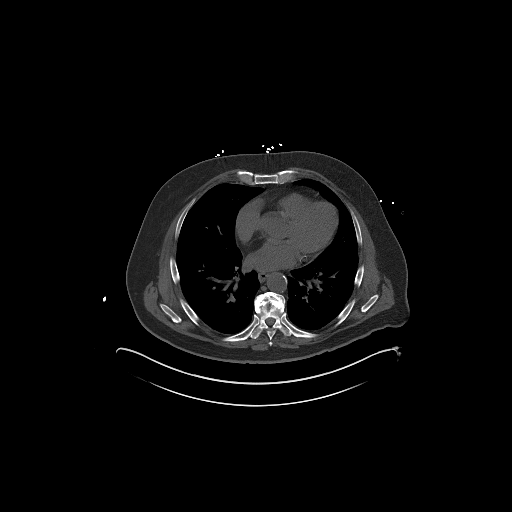
[im 64/83  vessel]
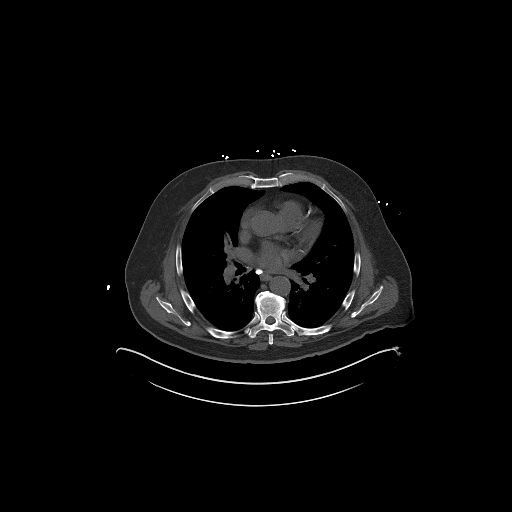
[im 73/83  vessel]
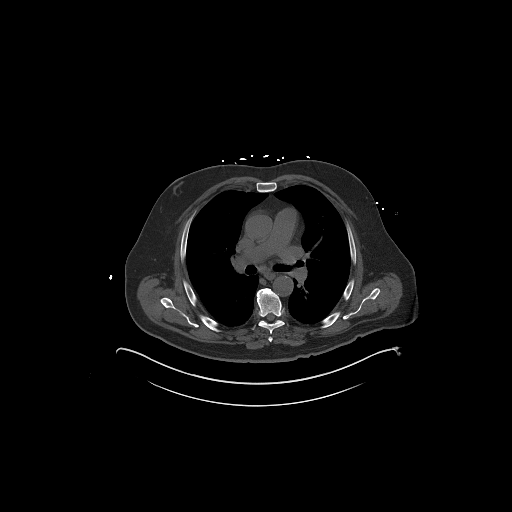

[16 of 20 positions shown; findings below may reference images not displayed]

FINDINGS: Study Quality
* Overall, the quality of the images are excellent.
Image Perfusion Description
* Normal stress and rest myocardial perfusion images with no evidence of ischemia or
infarction.
Wall Motion/Thickening
* Gated images reflect normal wall motion and wall thickening.
Ejection Fraction
* The resting left ventricular ejection fraction is calculated as 64%.
* The stress EF is calculalted as 68%.
Summed Scores
* The Summed Rest Score is 0, the Summed Stress Score is 0, and the Summed Difference Score is
0.
TID measurement
* The TID ratio is normal.
* TID is calculated as 0.93.
TID Measurement
* The TID ratio is normal.* TID is calculated as 0.93.
Other Findings
* Limited unenhanced CT of the chest done without contrast for attenuation correction.  Refer
to radiologist separate final interpretation of this chest CT.
No significant pericardial effusion.
Three-vessel coronary calcification noted.
Mildly decreased global myocardial blood flow reserve
Interpreting Physician:
MARITJIE CHAMBAL
Updated by  Chai Tiger  on  06/10/2018 [DATE]

## 2018-07-30 IMAGING — CR Abdomen Series
7 series · 7 of 7 positions shown · non-contrast
Comparison: none

Constipation and pain
EXAM:  COMPLETE ABDOMINAL SERIES:
CLINICAL INDICATION: constipation

[AP (1 of 7)]
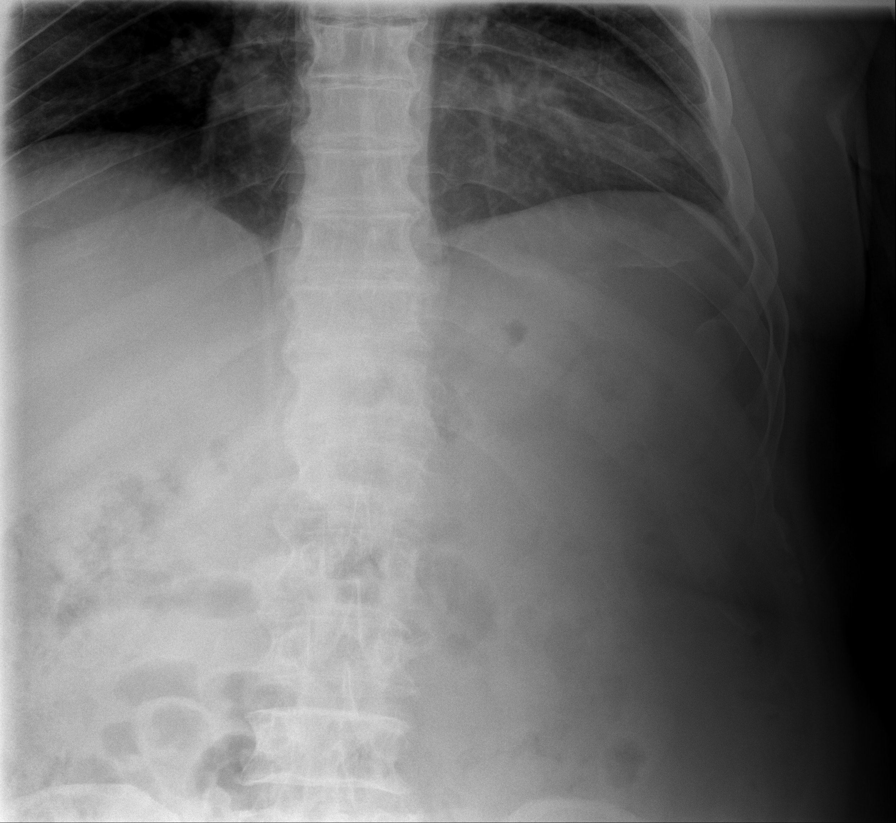

[AP (2 of 7)]
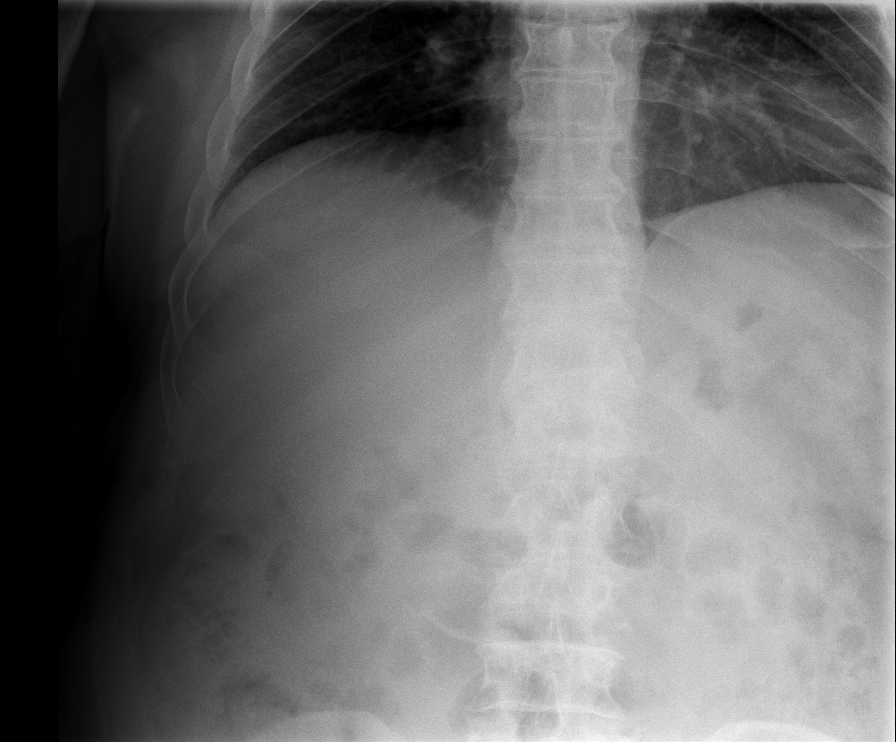

[AP (3 of 7)]
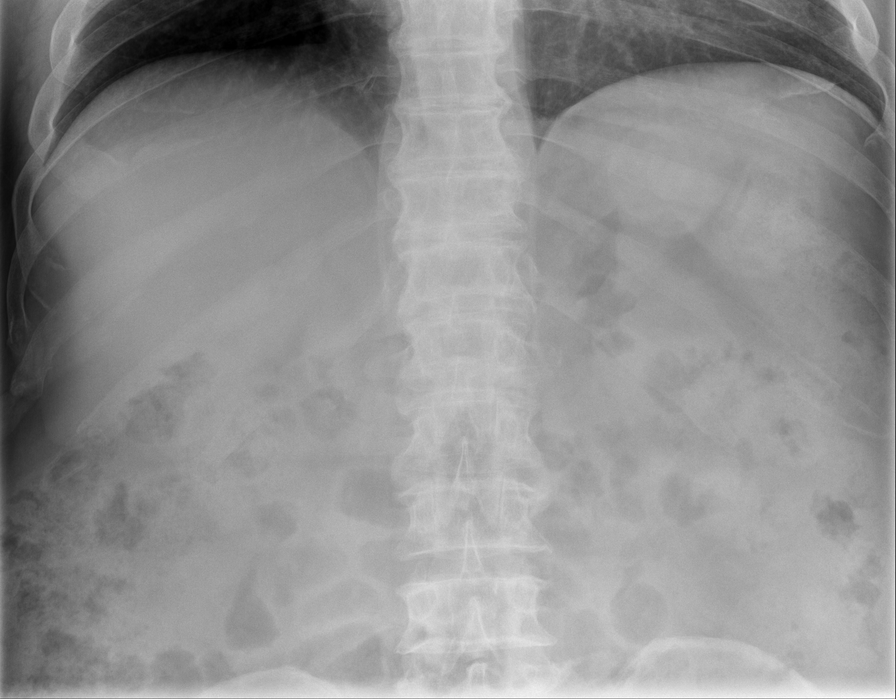

[AP (4 of 7)]
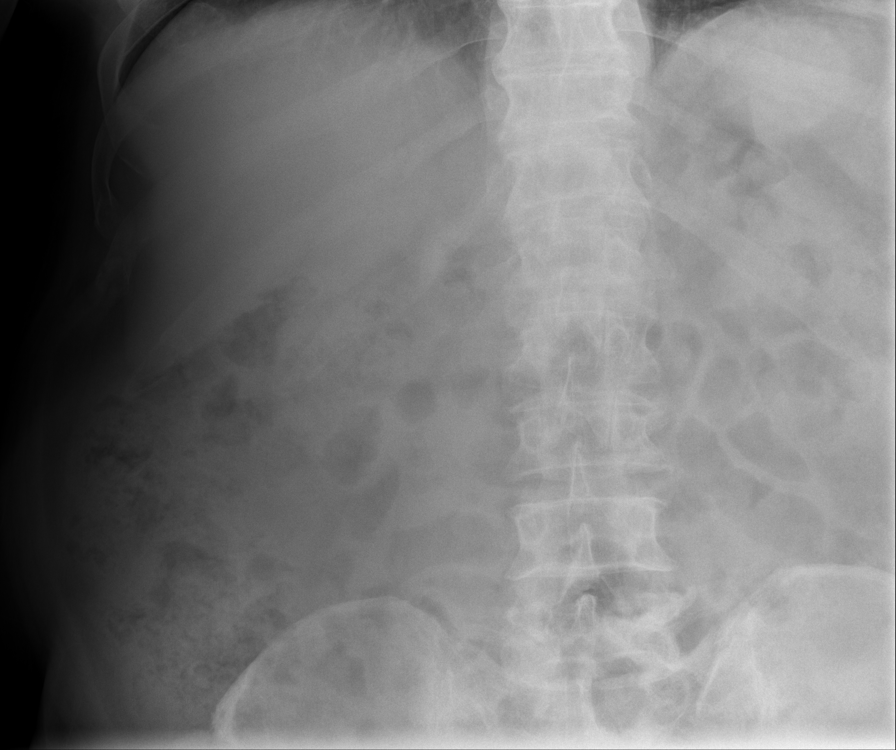

[AP (5 of 7)]
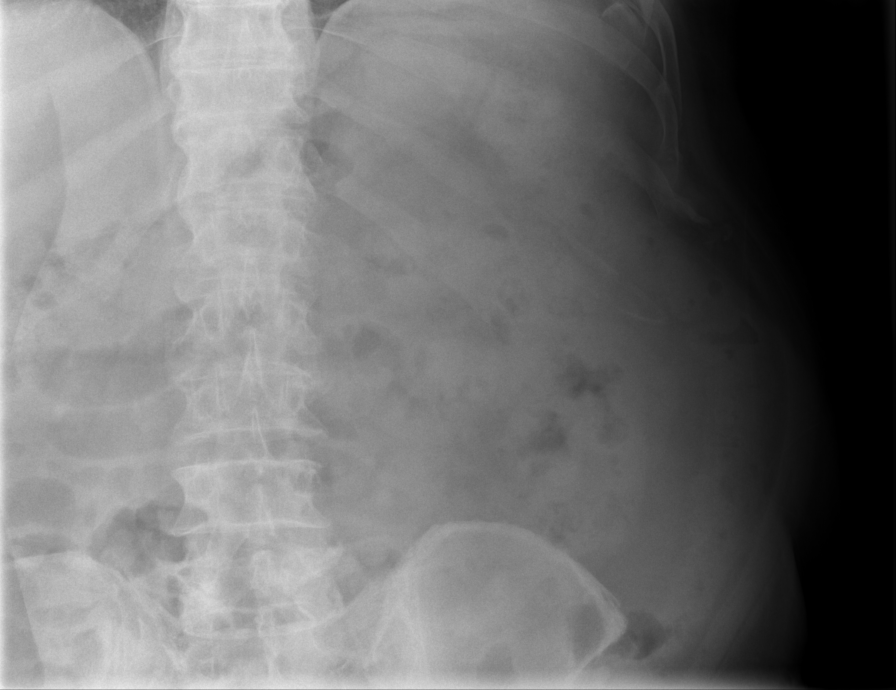

[AP (6 of 7)]
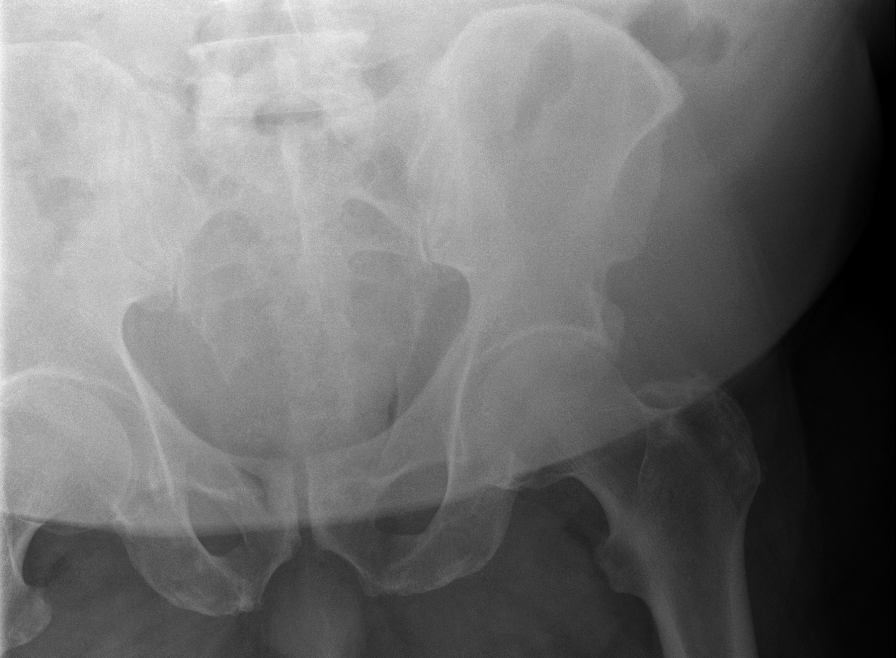

[AP (7 of 7)]
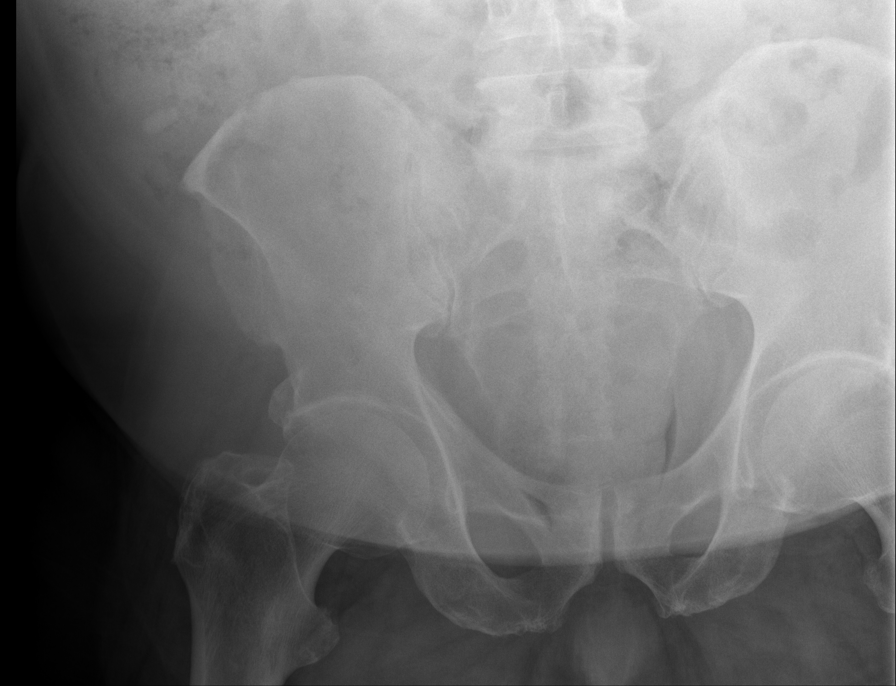

[7 of 7 positions shown; findings below may reference images not displayed]

FINDINGS: Supine and upright views of the abdomen and lower chest are presented. Lung bases are clear. Heart is upper limits of normal in size.
There is no visible organomegaly, mass or ascites. There is no free air. There is a nonobstructive bowel gas pattern with moderate stool suggesting mild constipation.
Within the right lower quadrant there is a 14 x 8 mm calcification which could represent an appendicolith, phlebolith or calcified lymph node versus overlying calcification in or on the patient.
There is mild vertebral body spurring.
IMPRESSION: 1. Constipation.
2. Indeterminate right lower quadrant calcification as described above.
3. Nonobstructive bowel gas pattern with no free air.
LOCATION CODE: 1

## 2019-02-10 IMAGING — CT CT CHEST W CONTRAST
2 of 5 series · 15 of 36 positions shown, 18 images · IV contrast (agent unspecified)
Comparison: none

EXAM: CT OF THE CHEST WITH CONTRAST:
Location number is 1
Medical indication:FEK.05
Reference:05/18/2018 examination.
TECHNIQUE: Informed consent was obtained for the administration of contrast.  Multiple axial images were obtained through the chest following the uneventful administration of intravenous contrast.
Images are obtained not using a pulmonary arteriogram protocol.

[Series 8: lung recon · axial · 0.98mm/px · z∈[+556,+858]mm · 12 of 143 slices shown, 15 images]
[im 11/143  mediastinal]
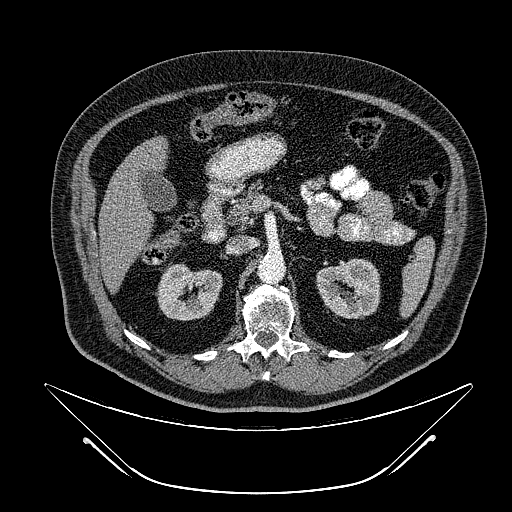
[im 11/143  lung]
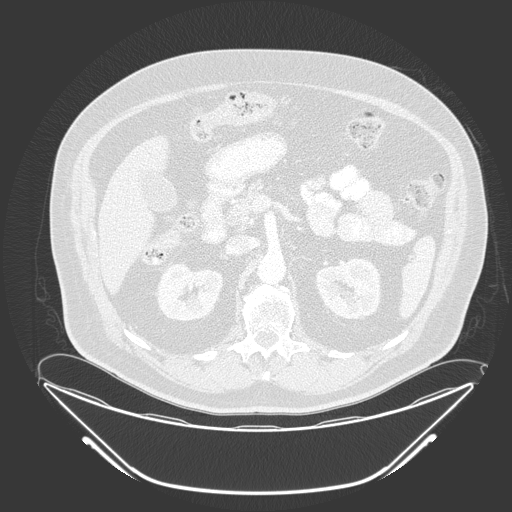
[im 22/143  lung]
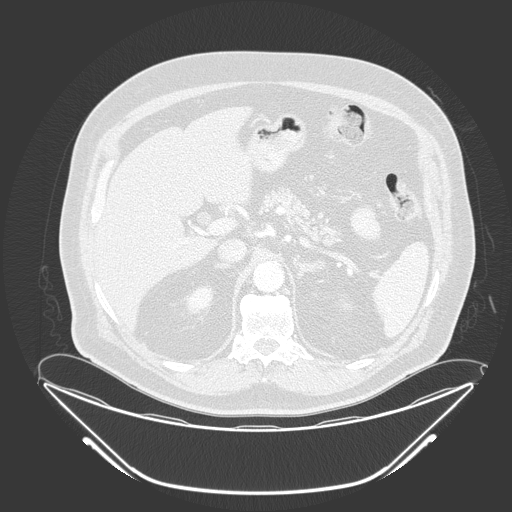
[im 33/143  lung]
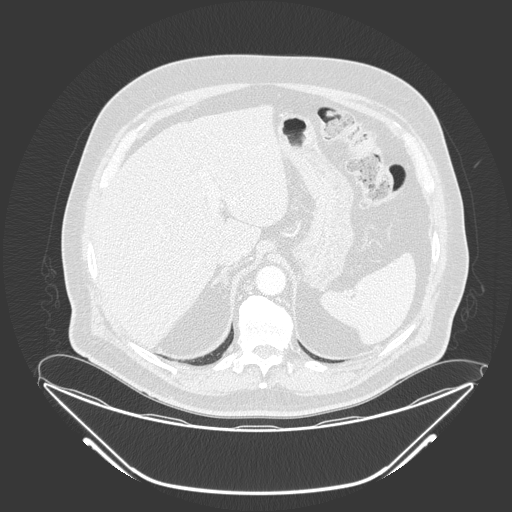
[im 44/143  lung]
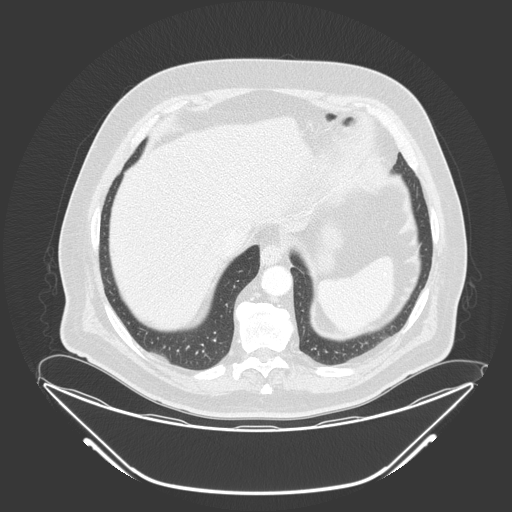
[im 55/143  mediastinal]
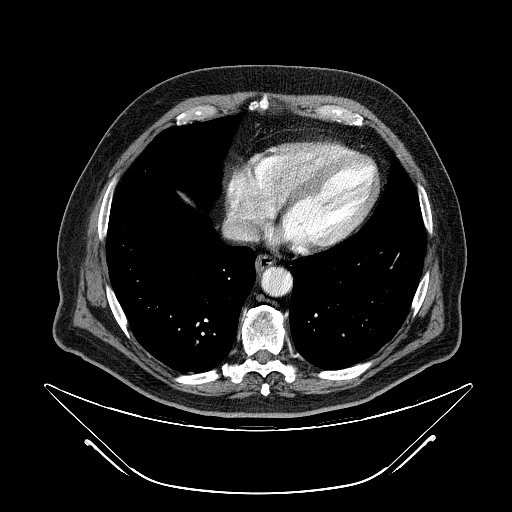
[im 55/143  lung]
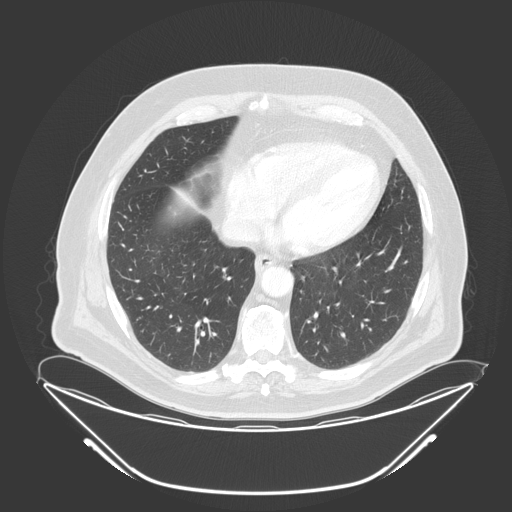
[im 66/143  lung]
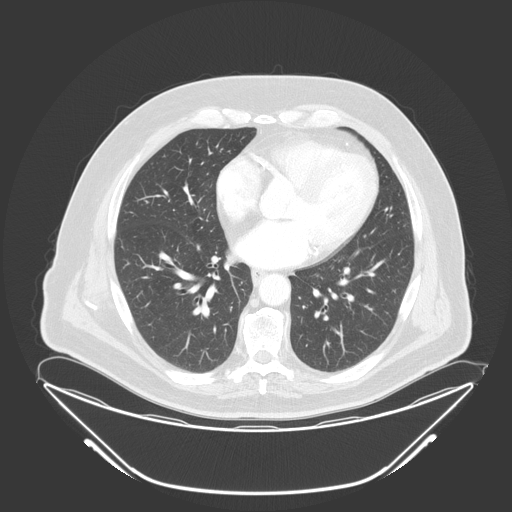
[im 77/143  lung]
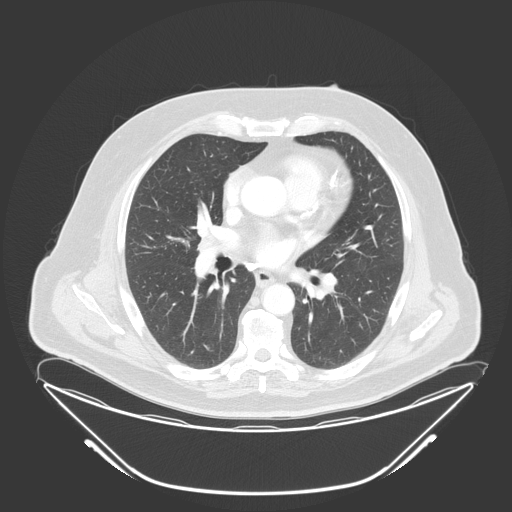
[im 88/143  lung]
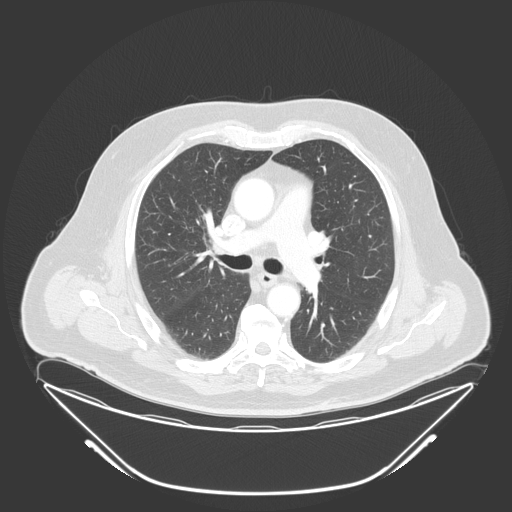
[im 99/143  mediastinal]
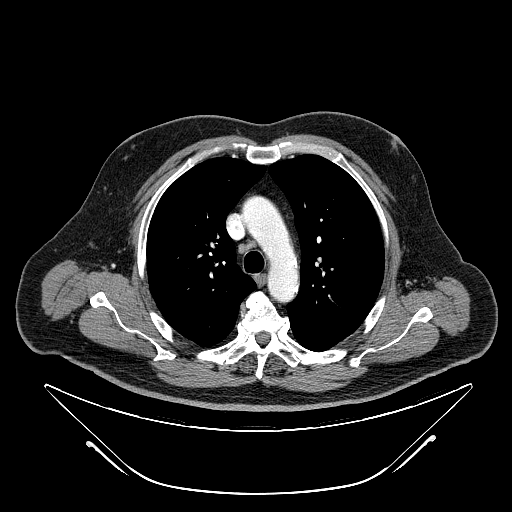
[im 99/143  lung]
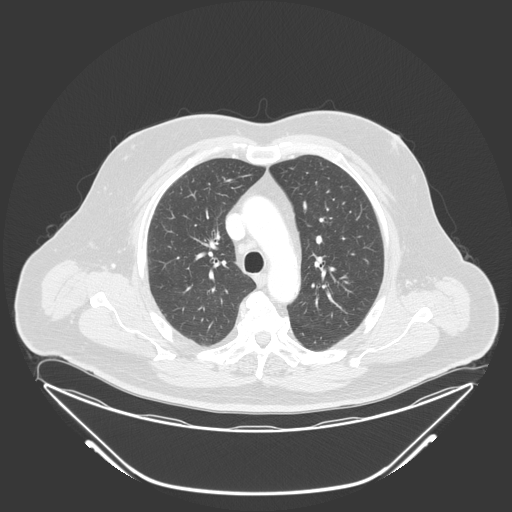
[im 110/143  lung]
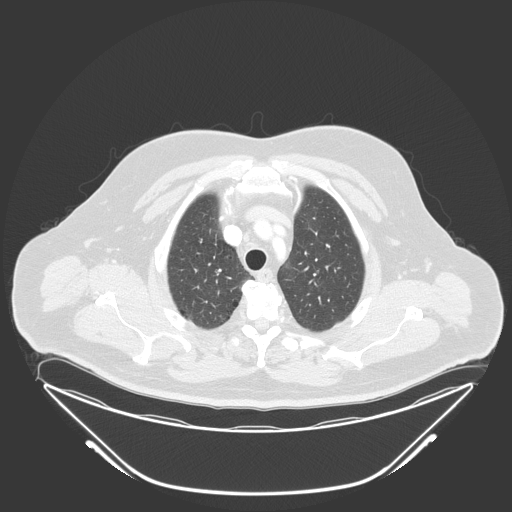
[im 121/143  lung]
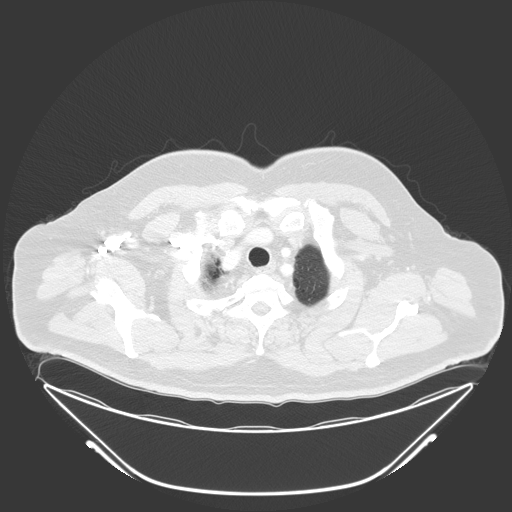
[im 132/143  lung]
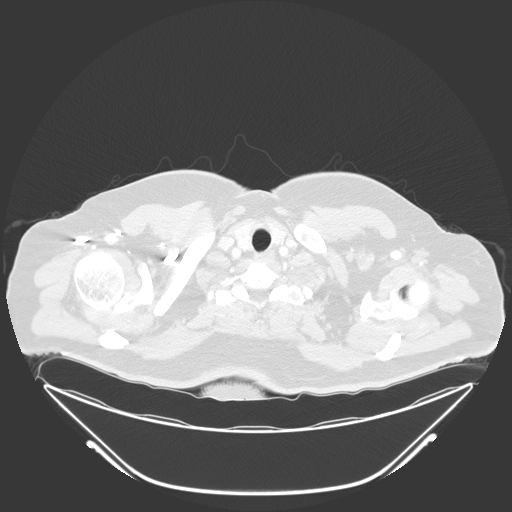

[mpr, cor, coronal · coronal · 0.97mm/px · 3 of 179 slices shown]
[im 36/179  lung]
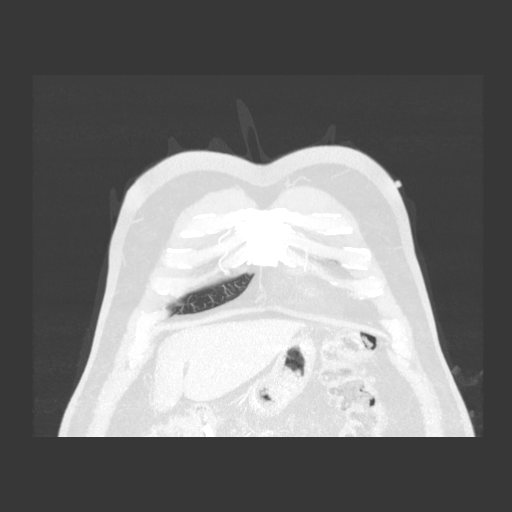
[im 72/179  lung]
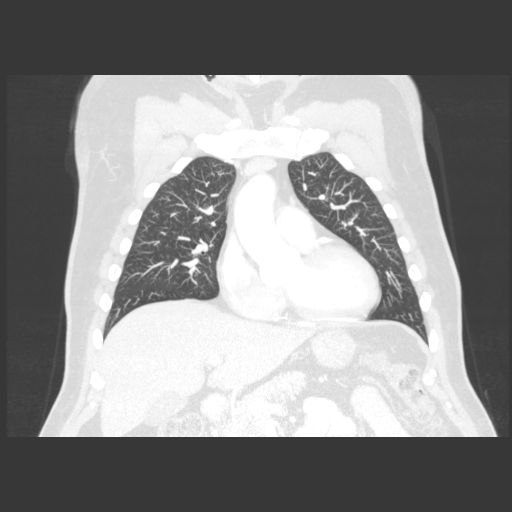
[im 107/179  lung]
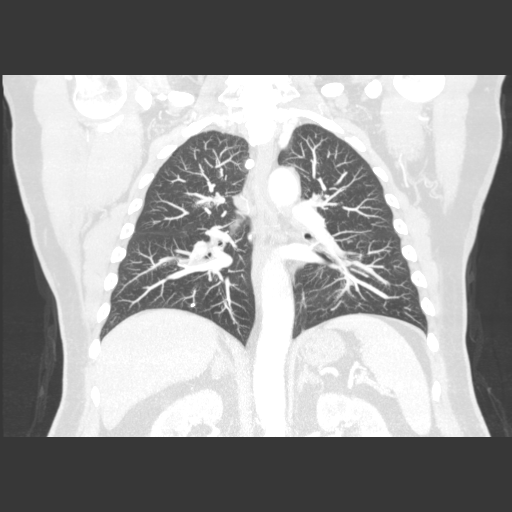

[15 of 36 positions shown; findings below may reference images not displayed]

FINDINGS: Lungs: Shows 5 to 6 mm calcified nodule right lung base.
Pulmonary Arteries:  Evaluation of pulmonary arteries is limited for pulmonary embolism. There is no gross abnormality of pulmonary arteries demonstrated.
Heart:  Is normal in size.  There is not a pericardial effusion.  There is calcification of coronary arteries.
Pleural Space:  This is not a pleural effusion.   .
Mediastinum:  Appears unremarkable.  .
Axilla: Axilla appear unremarkable.
Bones: Appear unremarkable.
Visualized upper abdominal viscera:Viscera appear unremarkable.
IMPRESSION: 5 mm calcified nodule right lung base otherwise lungs are clear.

## 2019-02-10 IMAGING — CT CT ABDOMEN PELVIS W CONTRAST
2 of 4 series · 17 of 46 positions shown, 19 images · IV contrast (CONTRAST)
Comparison: none

CT OF THE ABDOMEN WITH CONTRAST:
Location number is 1
CLINICAL INDICATION:
REFERENCE:  02/17/2018 examination.
TECHNIQUE: Informed consent was obtained for the administration of contrast.  Multiple axial images were obtained from the lung bases through the upper pelvis following the uneventful administration of oral and IV contrast.
TECHNIQUE: Informed consent was obtained for the administration of contrast.  Multiple axial images were obtained from the lower abdomen through the upper thighs following the uneventful administration of oral and IV contrast.

[Series 6: with · axial · 0.98mm/px · z∈[+220,+667]mm · 14 of 199 slices shown, 16 images]
[im 10/199  soft-tissue]
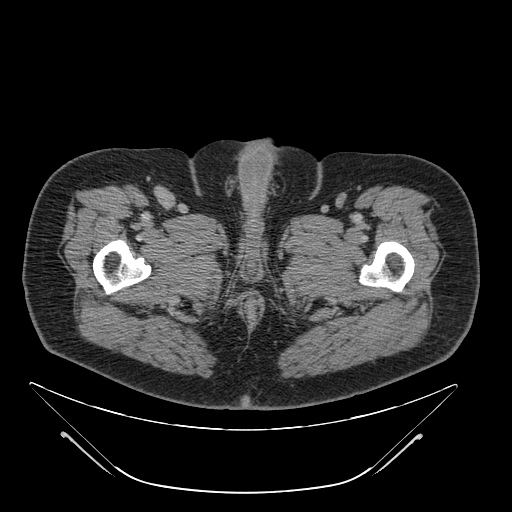
[im 10/199  bone]
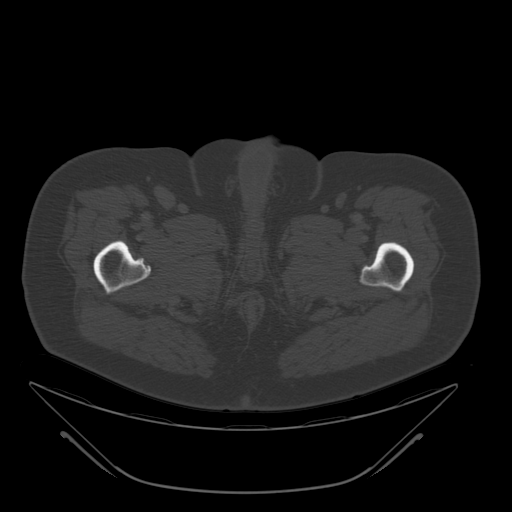
[im 30/199  soft-tissue]
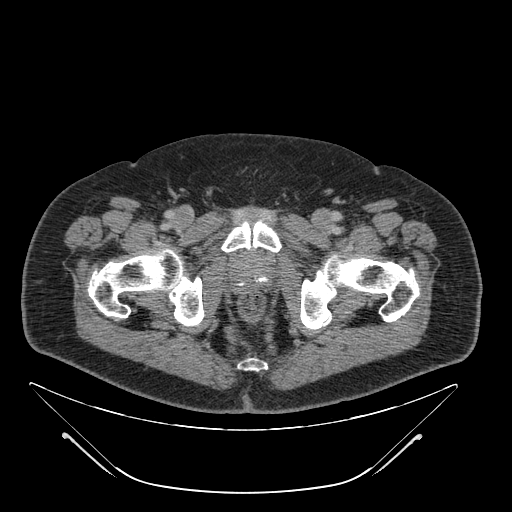
[im 40/199  soft-tissue]
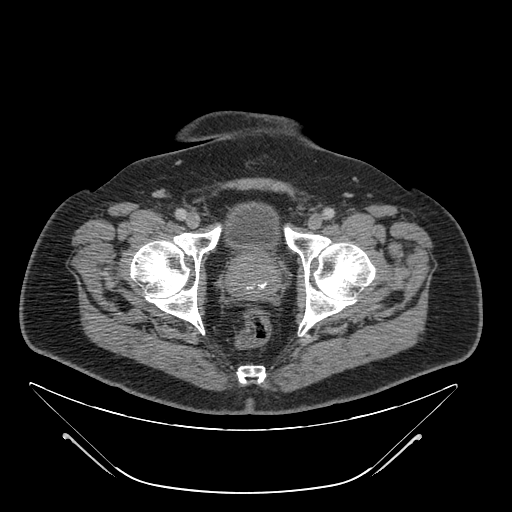
[im 50/199  soft-tissue]
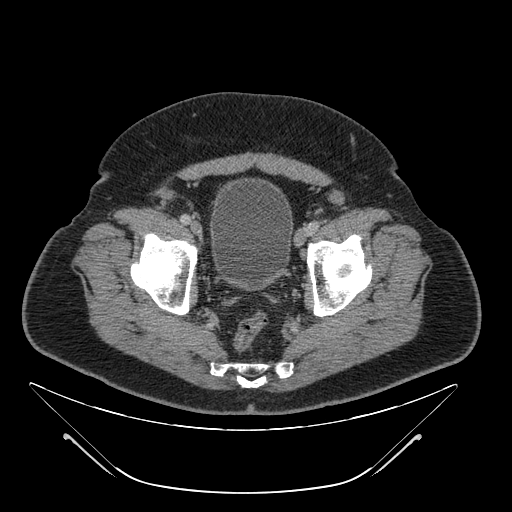
[im 70/199  soft-tissue]
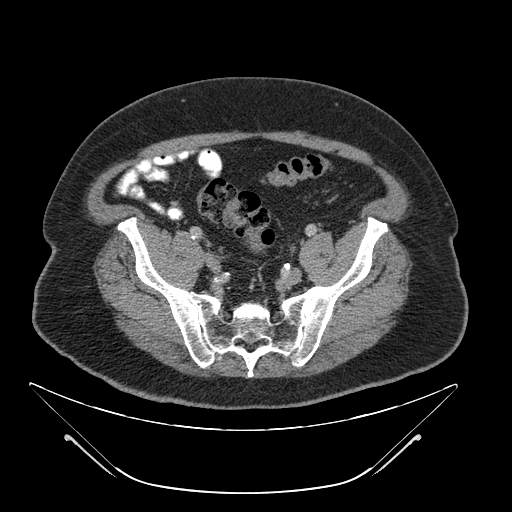
[im 80/199  soft-tissue]
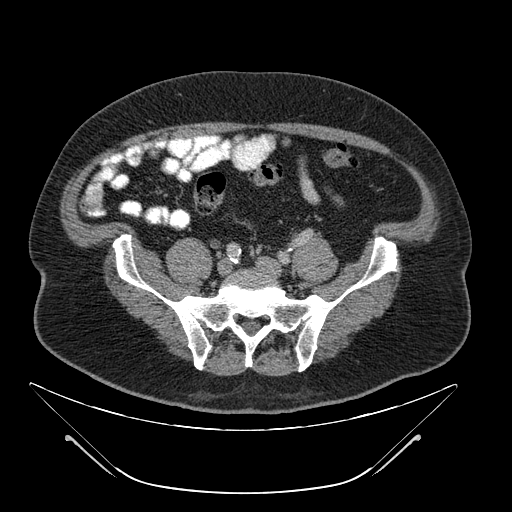
[im 90/199  soft-tissue]
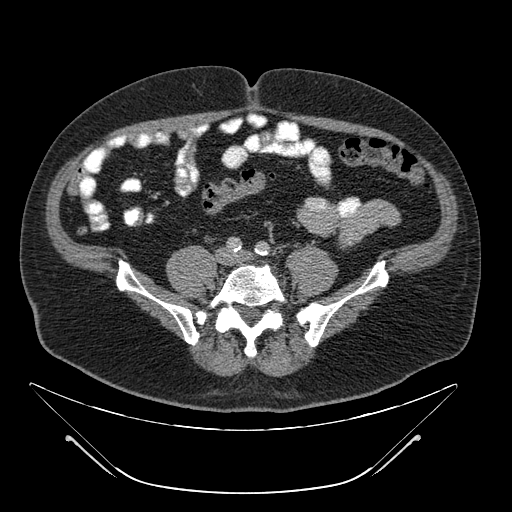
[im 109/199  soft-tissue]
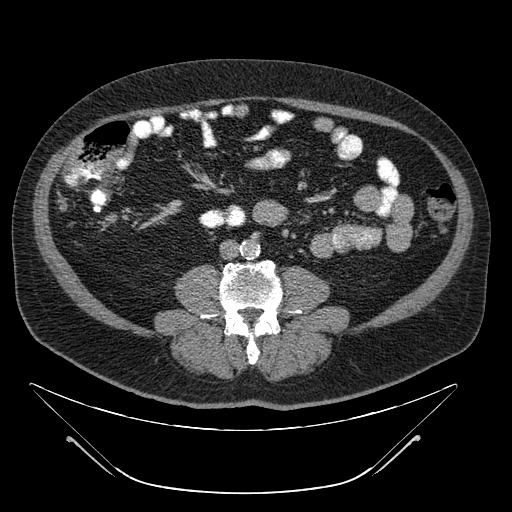
[im 119/199  soft-tissue]
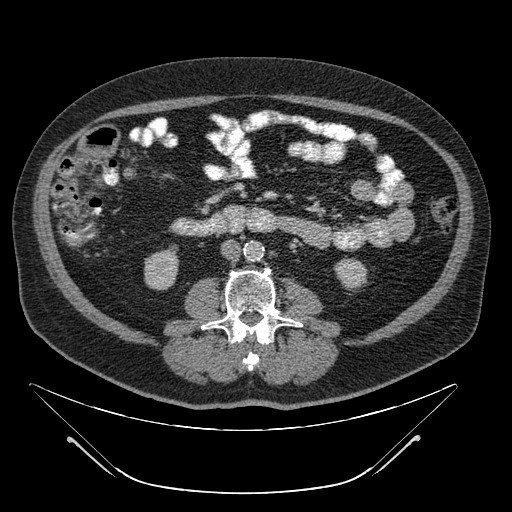
[im 119/199  bone]
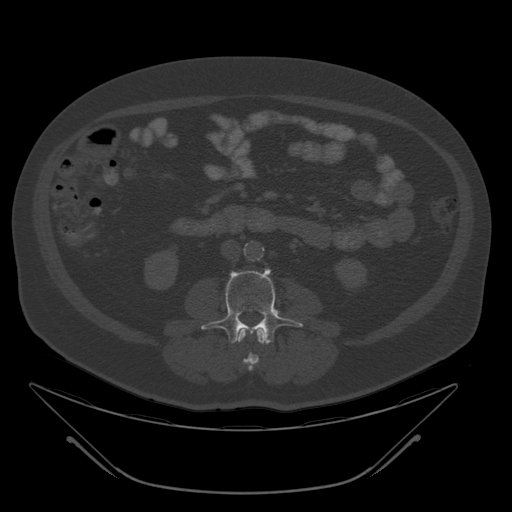
[im 129/199  soft-tissue]
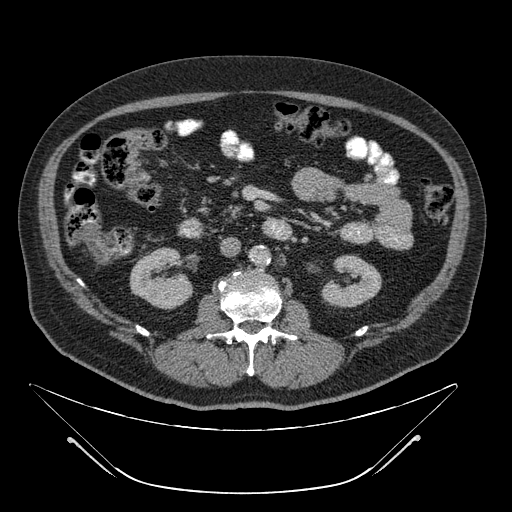
[im 149/199  soft-tissue]
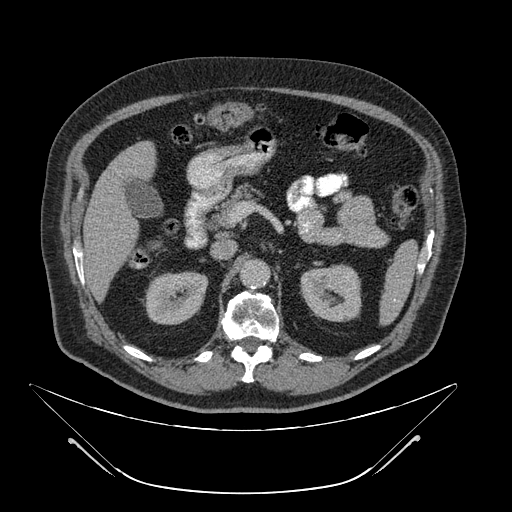
[im 159/199  soft-tissue]
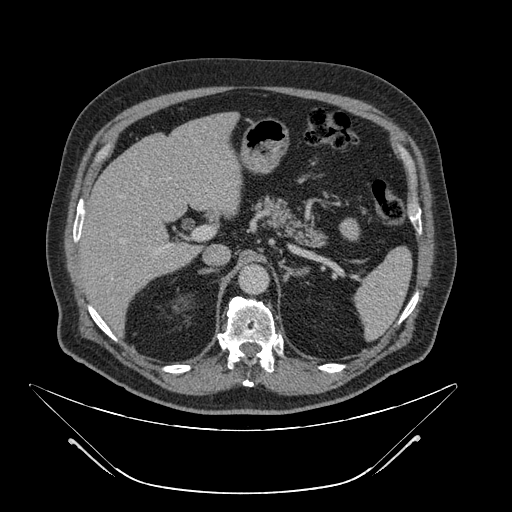
[im 169/199  soft-tissue]
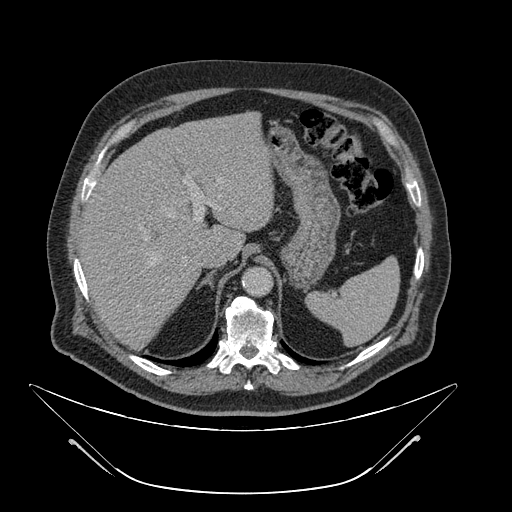
[im 189/199  soft-tissue]
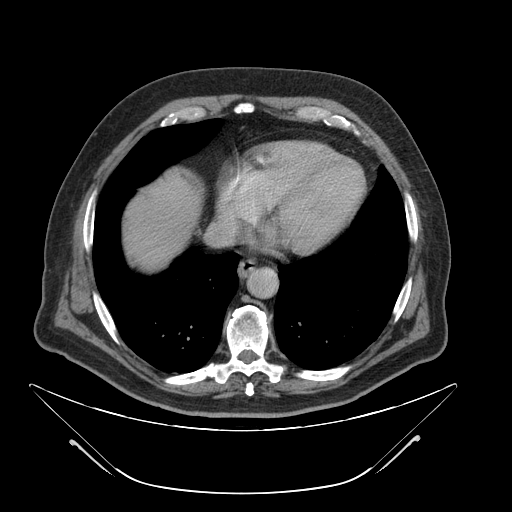

[mpr, coronal, coronal · coronal · 0.97mm/px · 3 of 185 slices shown]
[im 62/185  soft-tissue]
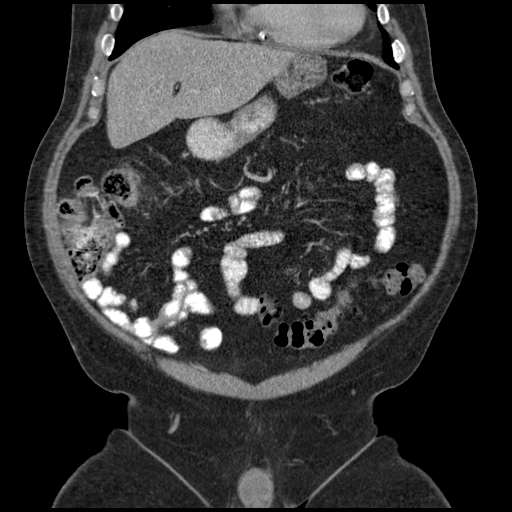
[im 82/185  soft-tissue]
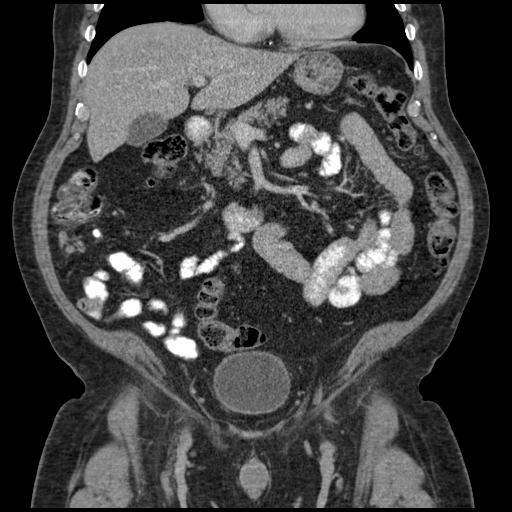
[im 103/185  soft-tissue]
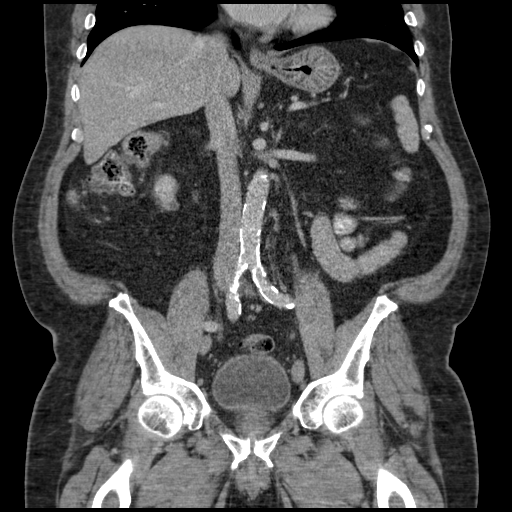

[17 of 46 positions shown; findings below may reference images not displayed]

FINDINGS: Lung bases:  Lung bases are clear.
Liver: Liver shows mildly diffusely decreased attenuation as on prior exam.
The gallbladder: Appears unremarkable.
Spleen: Spleen shows normal contour and attenuation.
Pancreas: Pancreas appears unremarkable.
Kidneys: There is exophytic hypodense lesion measuring 12 mm diameter medial aspect left kidney which appears to represent cyst. This is not increased in size from prior exam.
The bowel: Shows diverticula involving descending and sigmoid colon. .  Appendix measures 6 mm diameter.
Lymph nodes: There is no lymphadenopathy.
Abdominal wall:  Appears unremarkable.
Vessels:  Appear unremarkable.
Free fluid/free air:  There is no free air or free fluid.
Bones: Shows endplate hypertrophy multiple levels compatible with reactive changes to degenerative disc disease.
CT OF THE PELVIS WITH CONTRAST:
FINDINGS: Pelvic portions of large and small bowel: Appears unremarkable.
Bladder: Appears unremarkable.
Vessels:  Appear unremarkable.
Free fluid: There is no free fluid.
.
IMPRESSION: Fatty liver. This is unchanged.
Diverticulosis.
Left renal cyst.
No change from prior examination.

## 2019-02-10 IMAGING — CT CT SOFT TISSUE NECK W CONTRAST
2 of 3 series · 15 of 36 positions shown, 18 images · non-contrast
Comparison: CT scan 1995

CT SOFT TISSUE NECK W CONTRAST
INDICATION: [id number]
TECHNIQUE: Following informed consent IV contrast and routine images were obtained

[Series 10: stn · axial · 0.55mm/px · z∈[+824,+1046]mm · 12 of 105 slices shown, 15 images]
[im 8/105  mediastinal]
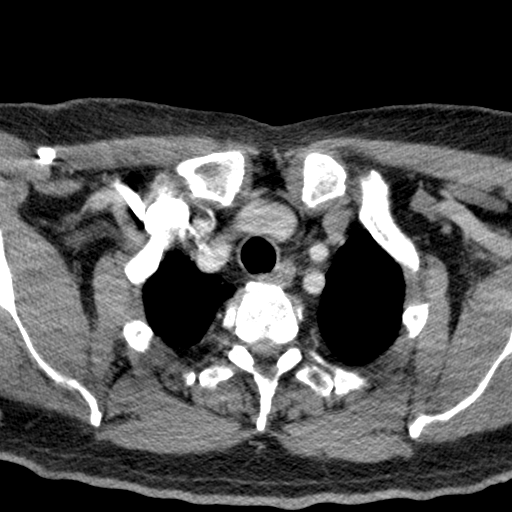
[im 8/105  lung]
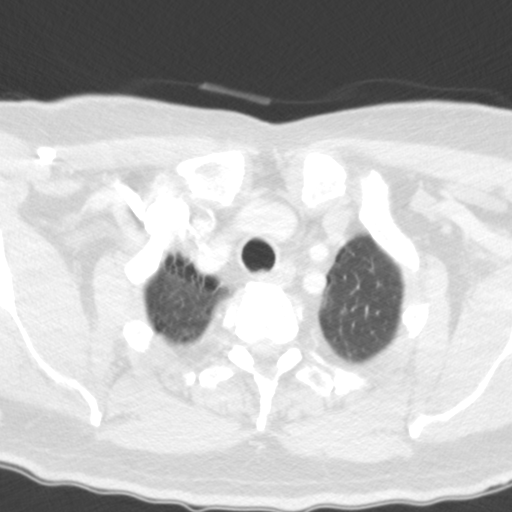
[im 16/105  lung]
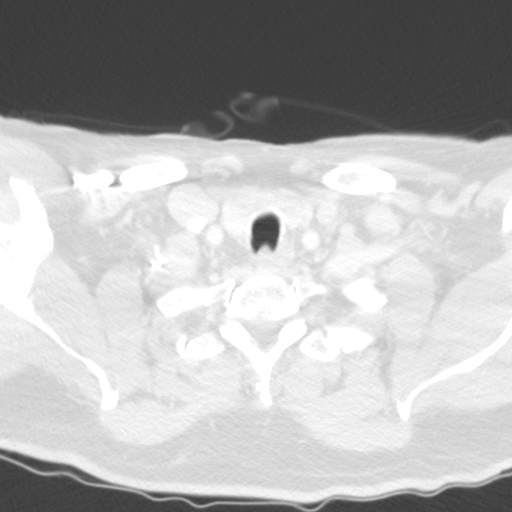
[im 24/105  lung]
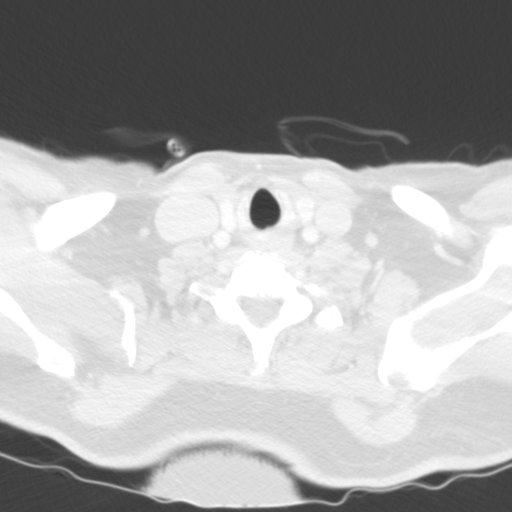
[im 31/105  lung]
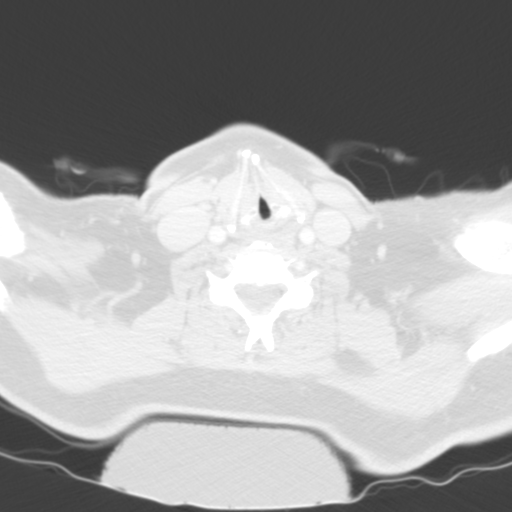
[im 39/105  mediastinal]
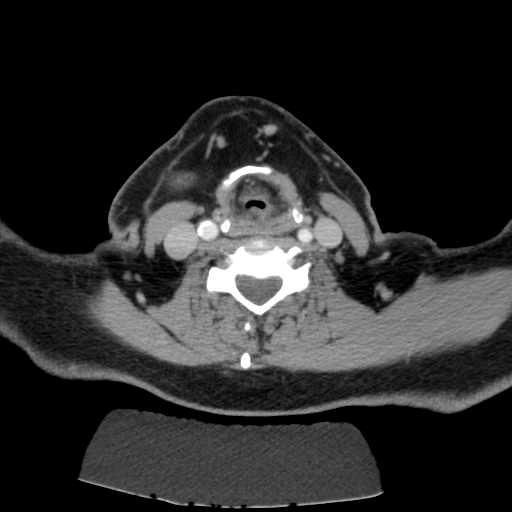
[im 39/105  lung]
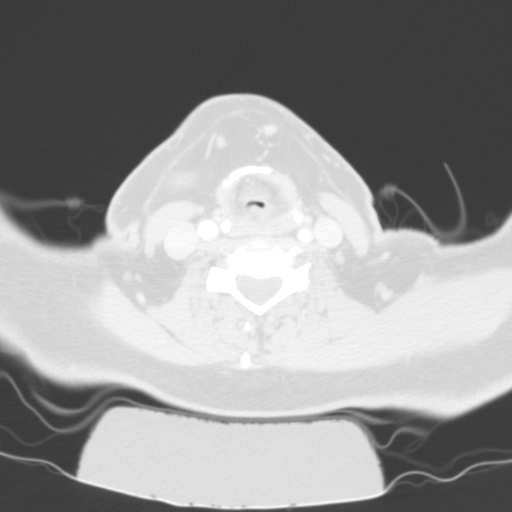
[im 47/105  lung]
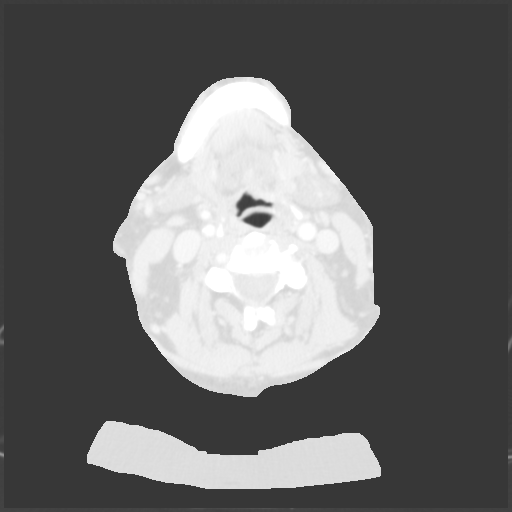
[im 58/105  lung]
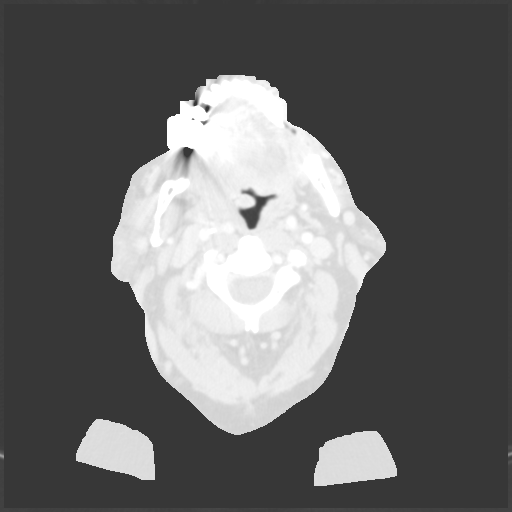
[im 66/105  lung]
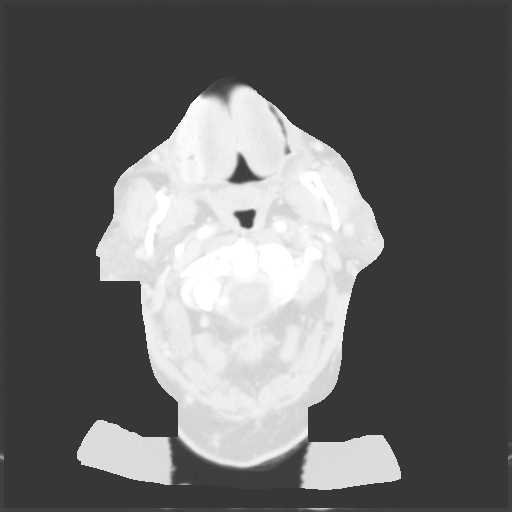
[im 74/105  mediastinal]
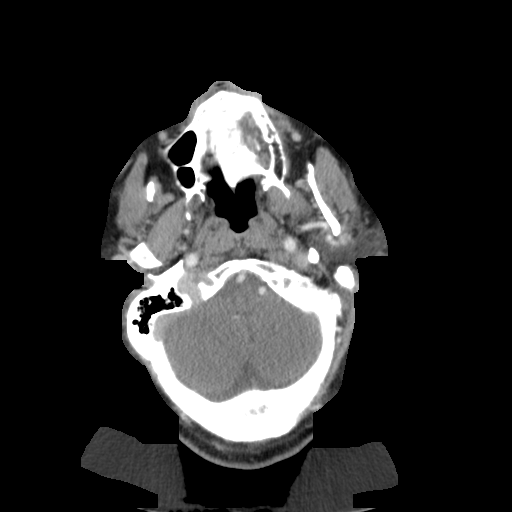
[im 74/105  lung]
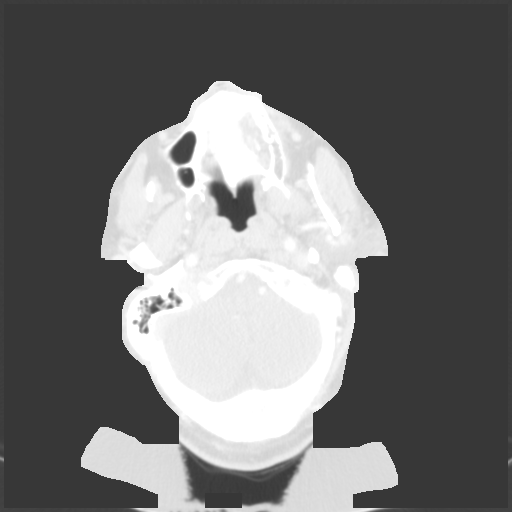
[im 81/105  lung]
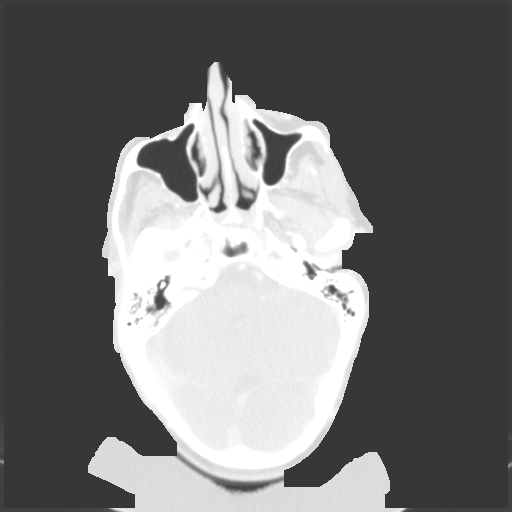
[im 89/105  lung]
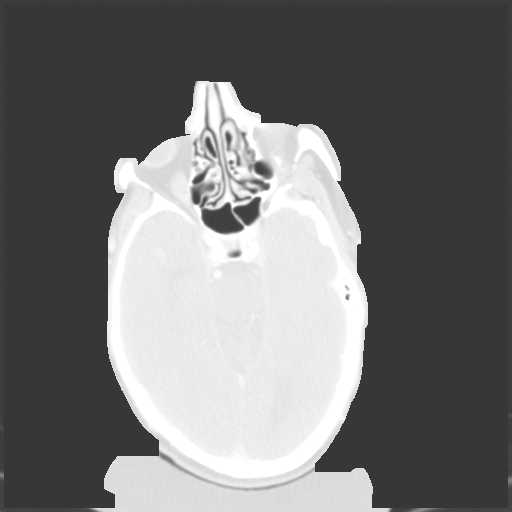
[im 97/105  lung]
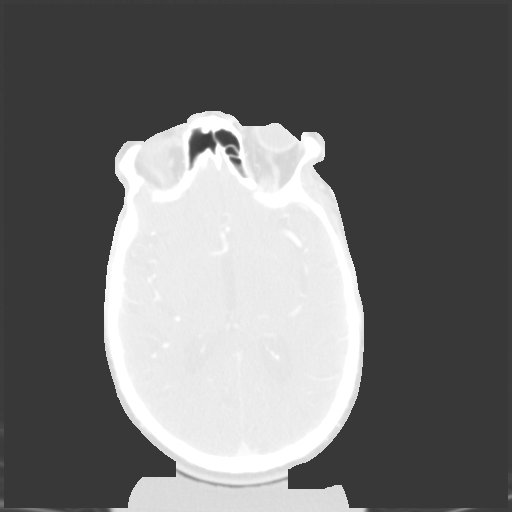

[thins · coronal · 0.55mm/px · 3 of 134 slices shown]
[im 27/134  lung]
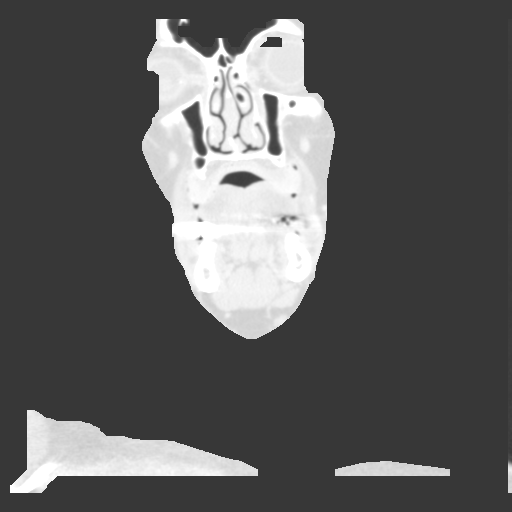
[im 54/134  lung]
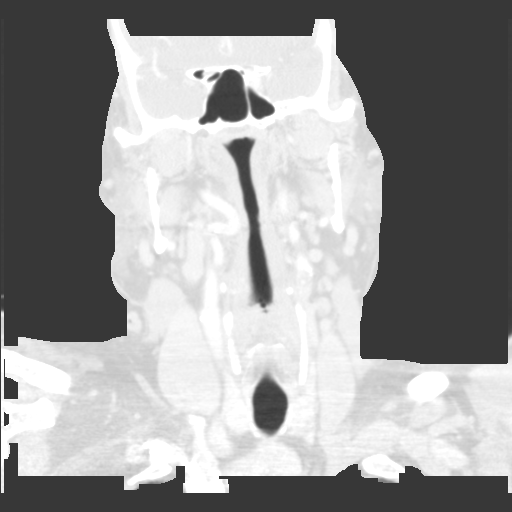
[im 80/134  lung]
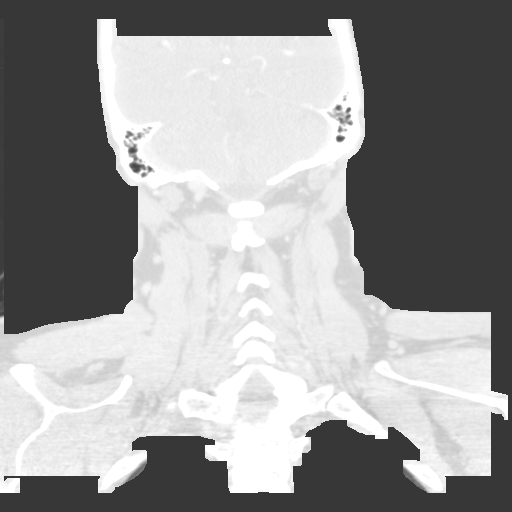

[15 of 36 positions shown; findings below may reference images not displayed]

FINDINGS: The visualized brain parenchyma demonstrates no significant abnormality. The globes retroconal space and fat are normal. Posterior nasopharynx is normal. Tonsillar pillars parapharyngeal spaces clear. Parotid glands and space demonstrates no abnormality. Submandibular glands and space are unremarkable. Submental space normal.
Floor the mouth and tongue base demonstrates no significant abnormality. Uvula is midline. Vallecula and piriform sinuses are normal. Epiglottis or aryepiglottic folds and true and false cords unremarkable.
The right carotid space and sheath demonstrates no significant abnormality with stable medial deviation of the right internal carotid. Left carotid space and sheath demonstrates no significant mass or adenopathy.
Subglottic airway is normal. Thyroid and thoracic inlet demonstrate no significant abnormality.
Lung fields are clear.
Bone windows demonstrates clear sinuses without bony lytic or destructive changes
IMPRESSION: 
IMPRESSION: Stable appearance of the neck. No neck mass or adenopathy
Location: 1

## 2019-05-30 IMAGING — CR XR CHEST 1 VIEW
1 series · 1 of 1 positions shown · non-contrast
Comparison: none

Exam: Chest one view
Today's exam is compared to the previous study of 06/09/2018.
Heart is normal in size. Mediastinal contours within normal limits. Trachea is midline. Lungs are clear bilaterally. Bony structures are grossly intact.

[AP]
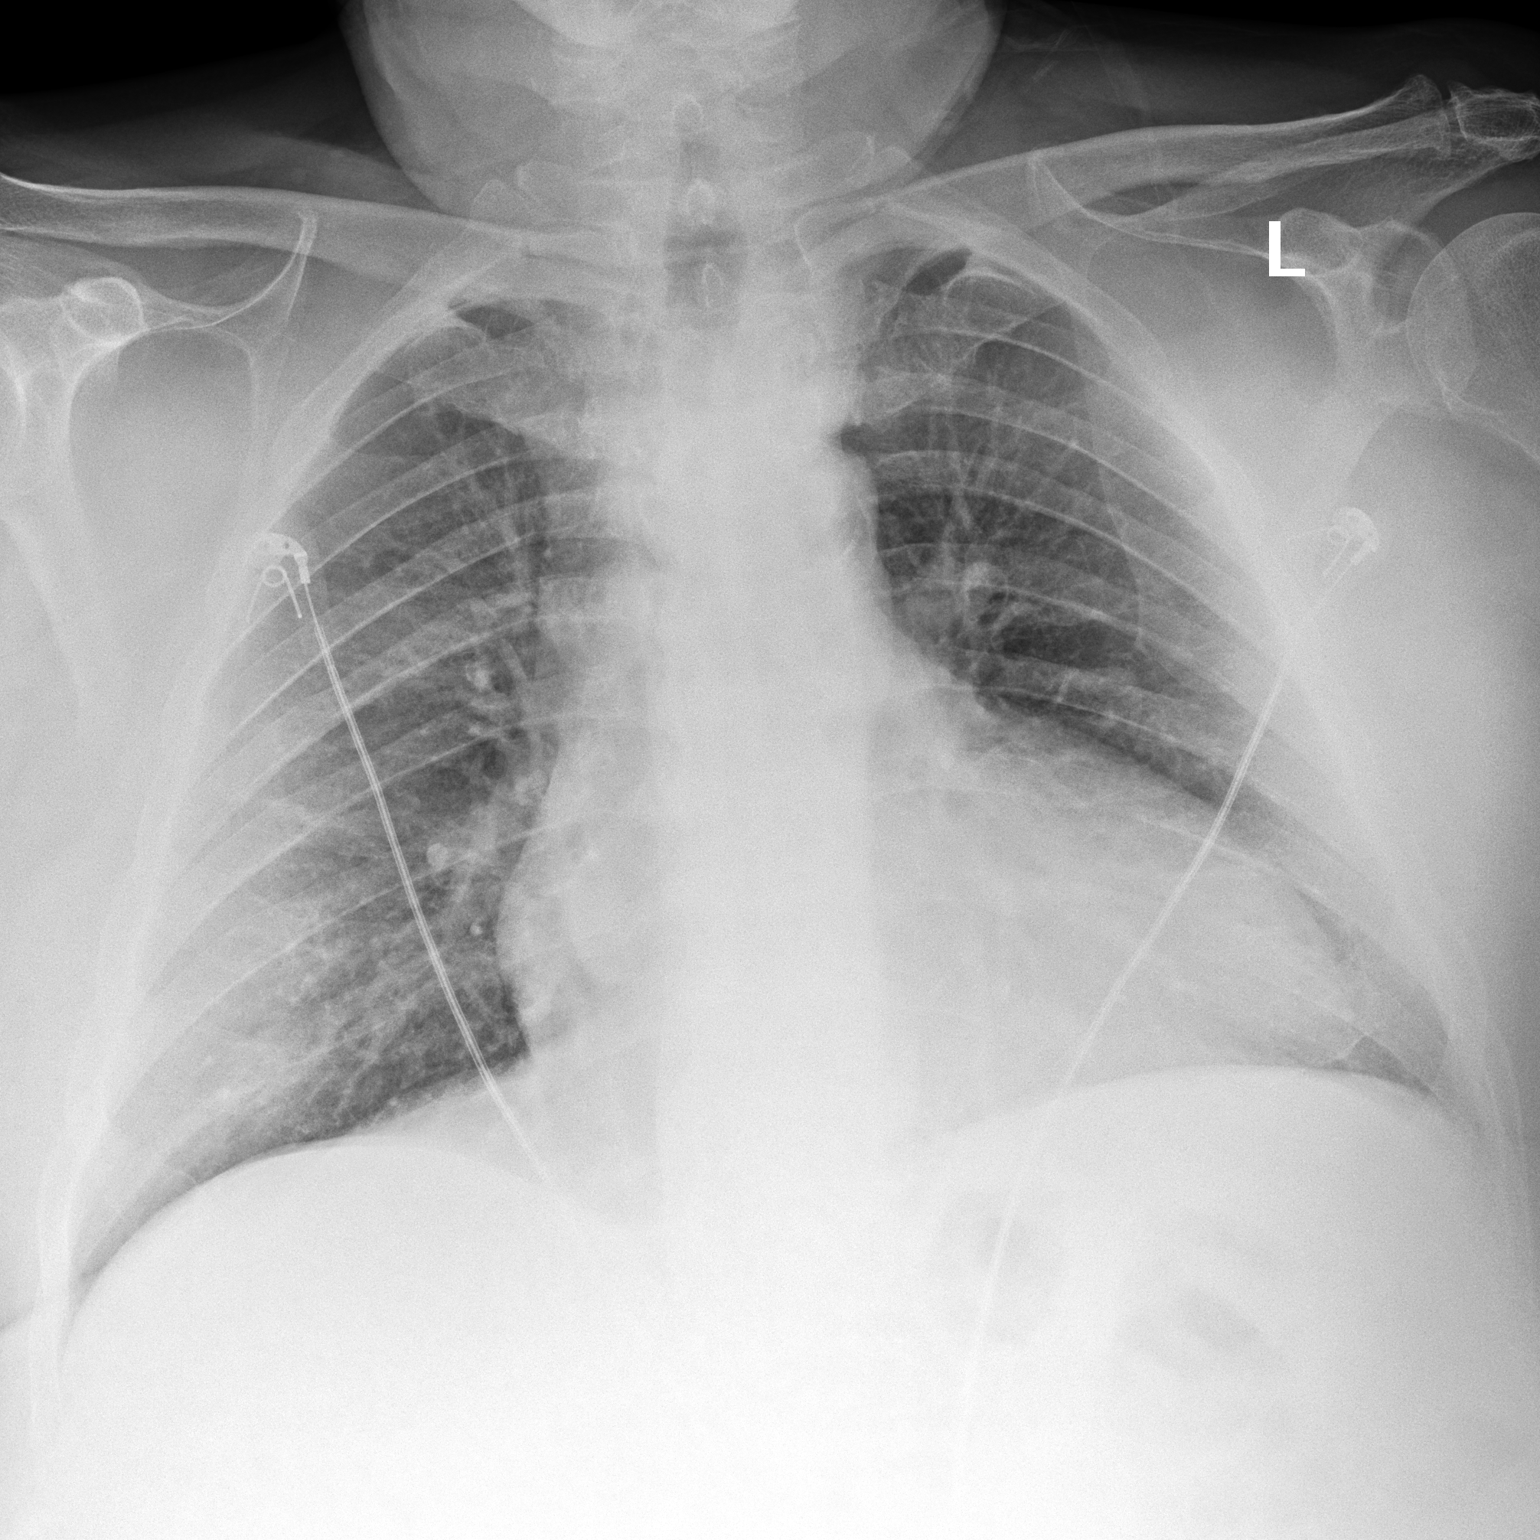

[1 of 1 positions shown; findings below may reference images not displayed]

IMPRESSION: No acute cardia pulmonary process.

## 2019-06-02 IMAGING — CR XR CHEST 1 VIEW
1 series · 1 of 1 positions shown · non-contrast
Comparison: Chest x-ray 05/30/2019

XR CHEST 1 VIEW
INDICATION: dyspnea on exertion
TECHNIQUE: Single view

[AP]
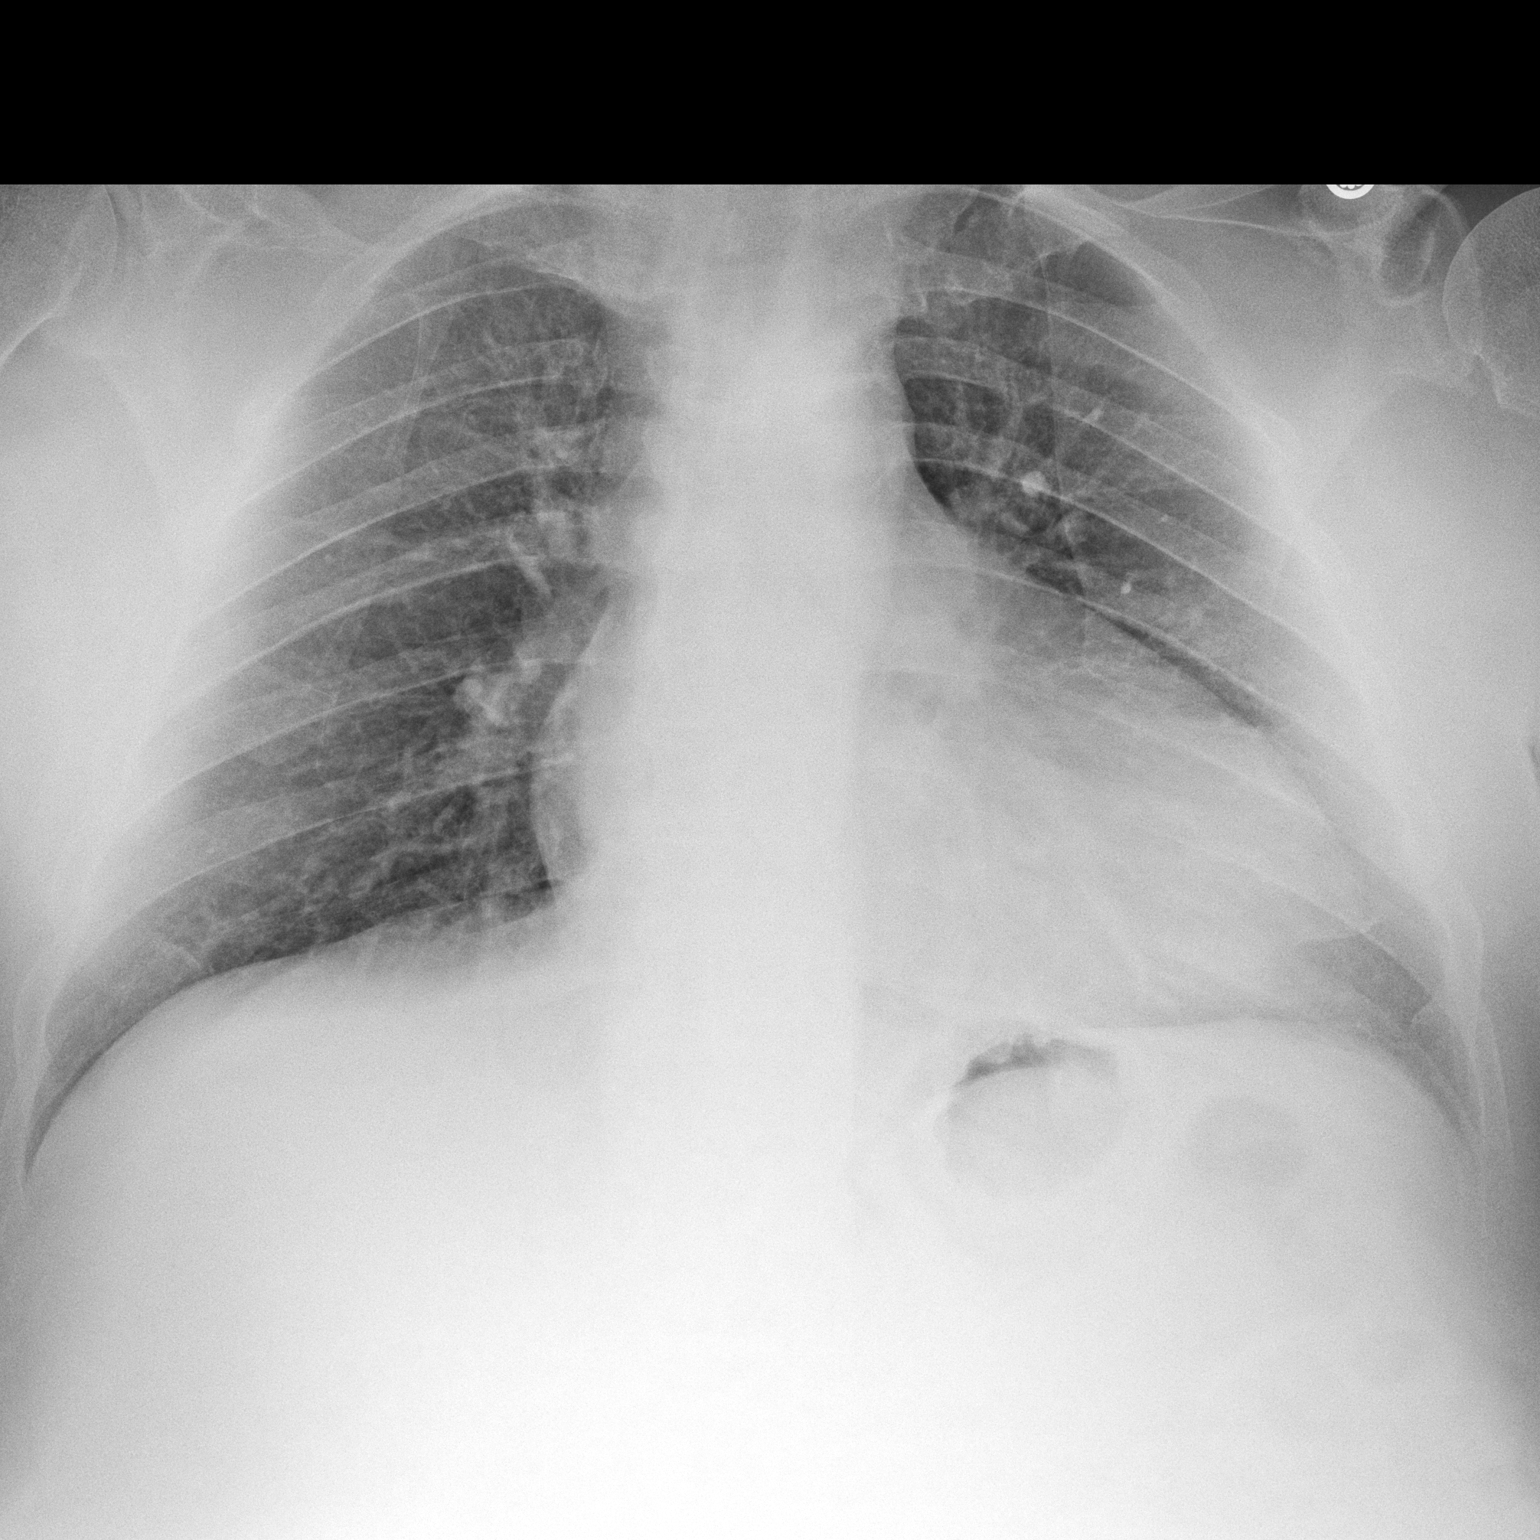

[1 of 1 positions shown; findings below may reference images not displayed]

FINDINGS: Cardiac silhouettes upper limits of normal with left ventricular prominence unchanged. Lung fields are free of active infiltrate or pleural effusion.
IMPRESSION: 
IMPRESSION: Left ventricular prominence. No acute parenchymal disease
Location: 1

## 2019-06-10 IMAGING — MR MRI BRAIN WO CONTRAST
12 series · 48 of 48 positions shown · non-contrast
Comparison: none

Brain MRI without contrast.
All brain whole brain technique is utilized. There is no abnormality demonstrated of the right or left vestibule or cochlea. There are scattered subcentimeter foci of hyperintensity as well as one focus measuring 1 cm diameter of hyperintensity on T2 and FLAIR images involving the deep white matter frontal and parietal. The vessels show normal flow void. There is no mass effect. There is no abnormal extra-axial fluid collection. Right left optic globes appear unremarkable. The optic nerves appear unremarkable.

[Series 1: survey · sagittal · 1.6mm · 1.62mm/px · 16 of 128 slices shown]
[im 1/128]
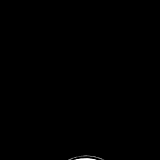
[im 9/128]
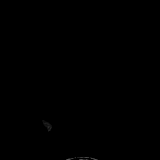
[im 17/128]
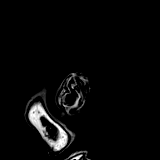
[im 26/128]
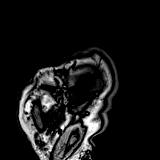
[im 34/128]
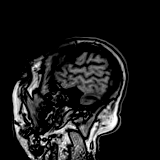
[im 43/128]
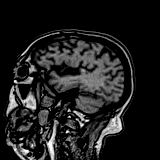
[im 51/128]
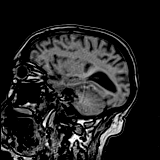
[im 60/128]
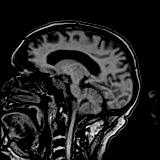
[im 68/128]
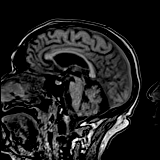
[im 77/128]
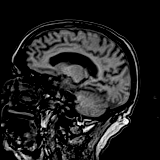
[im 85/128]
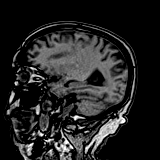
[im 94/128]
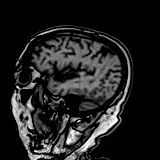
[im 102/128]
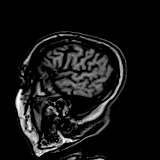
[im 111/128]
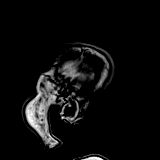
[im 119/128]
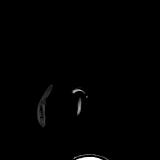
[im 128/128]
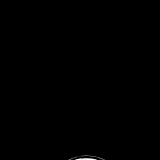

[Series 2: survey_mpr_sag · sagittal · 1.6mm · 1.60mm/px · 1 of 5 slices shown]
[im 1/5]
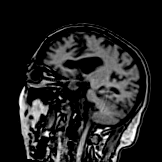

[Series 3: survey_mpr_cor · coronal · 1.6mm · 1.60mm/px · 1 of 3 slices shown]
[im 1/3]
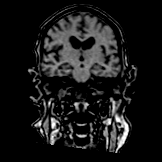

[Series 4: survey_mpr_tra · axial · 1.6mm · 1.60mm/px · 1 of 3 slices shown]
[im 1/3]
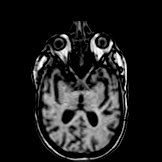

[Series 5: T1 · sagittal · 4.0mm · 0.72mm/px · 3 of 27 slices shown (1 of 2)]
[im 1/27]
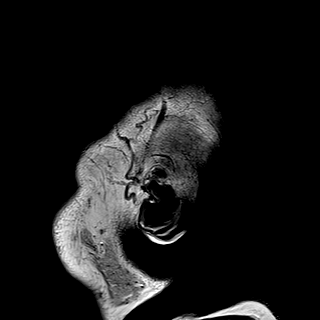
[im 14/27]
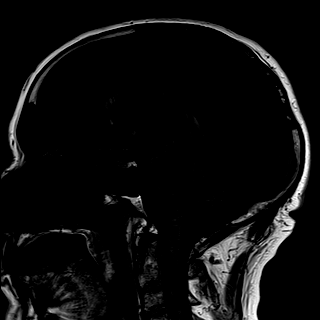
[im 27/27]
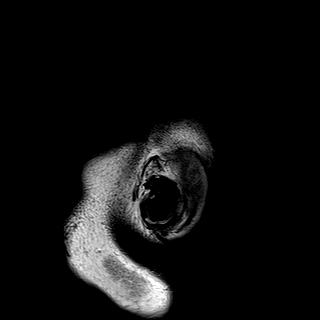

[Series 6: ax dwi_tracew · axial · 4.0mm · 0.60mm/px · z∈[-46,+100]mm · 3 of 28 slices shown]
[im 1/28]
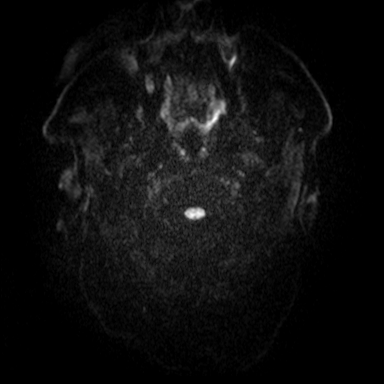
[im 14/28]
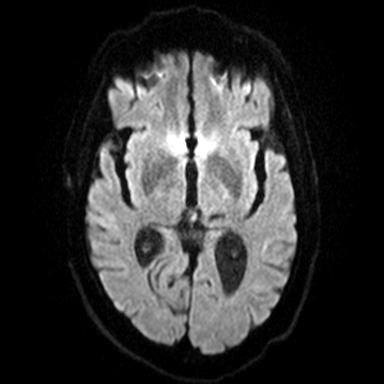
[im 28/28]
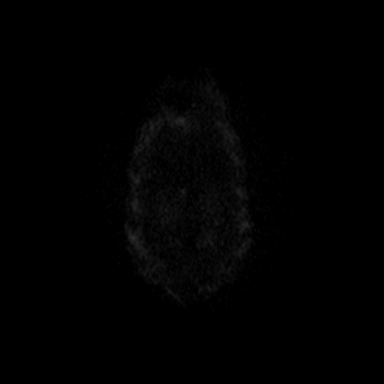

[Series 7: ax dwi_adc · axial · 4.0mm · 0.60mm/px · z∈[-46,+100]mm · 3 of 28 slices shown]
[im 1/28]
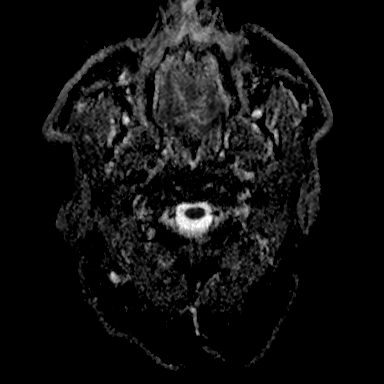
[im 14/28]
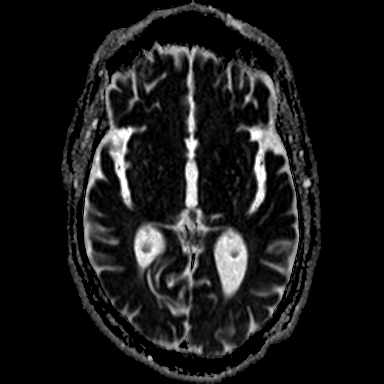
[im 28/28]
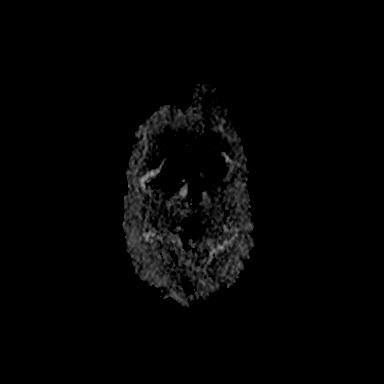

[Series 8: ax dwi_exp · axial · 4.0mm · 0.60mm/px · z∈[-46,+100]mm · 4 of 28 slices shown]
[im 1/28]
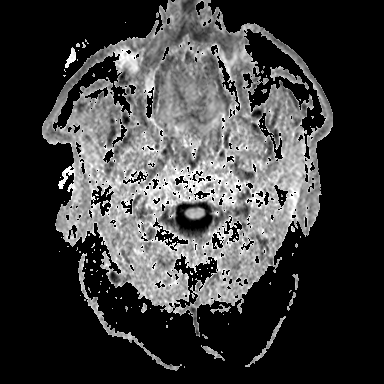
[im 10/28]
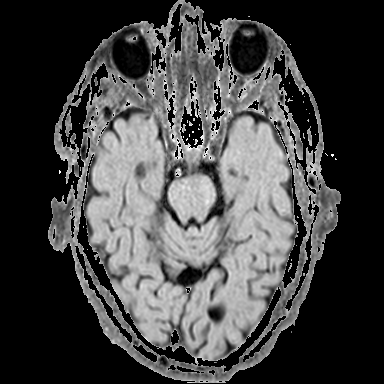
[im 19/28]
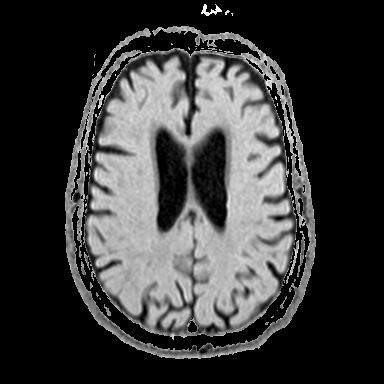
[im 28/28]
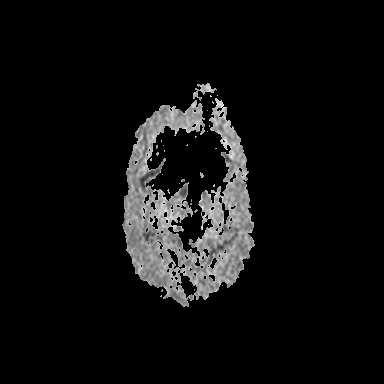

[Series 9: T2 · axial · 4.0mm · 0.51mm/px · z∈[-43,+103]mm · 4 of 28 slices shown]
[im 1/28]
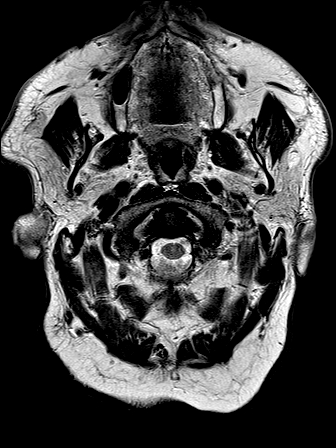
[im 10/28]
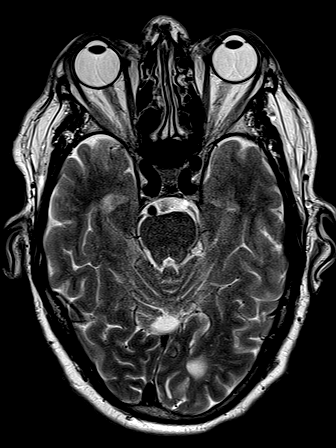
[im 19/28]
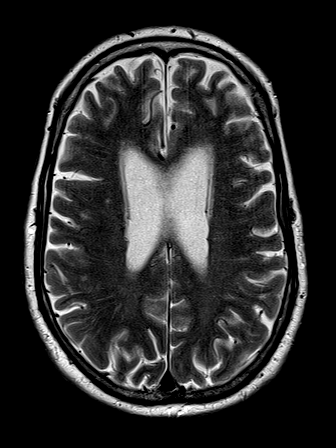
[im 28/28]
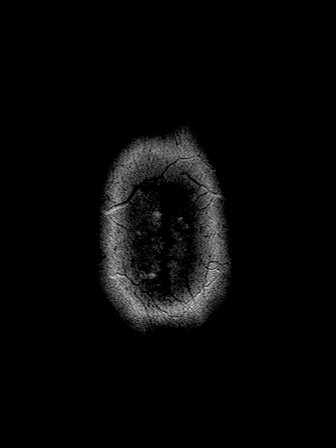

[Series 10: FLAIR · axial · 4.0mm · 0.72mm/px · z∈[-44,+102]mm · 4 of 28 slices shown]
[im 1/28]
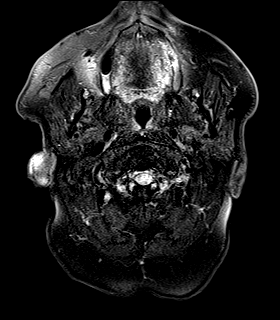
[im 10/28]
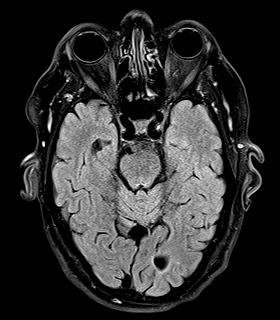
[im 19/28]
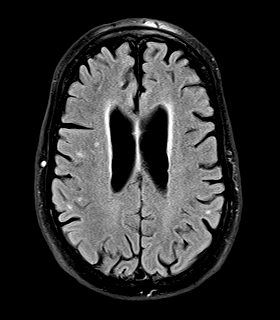
[im 28/28]
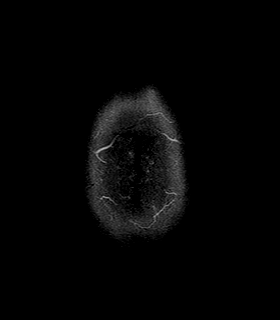

[Series 11: T1 · axial · 4.0mm · 0.72mm/px · z∈[-46,+100]mm · 4 of 28 slices shown (2 of 2)]
[im 1/28]
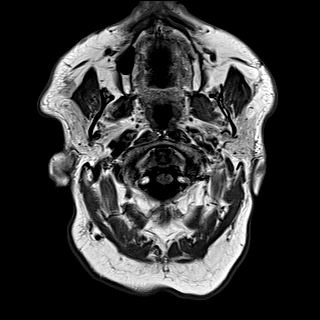
[im 10/28]
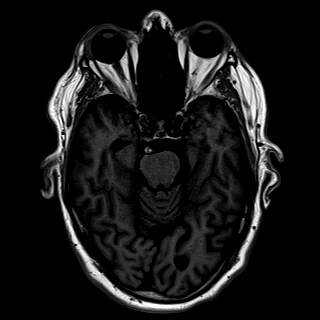
[im 19/28]
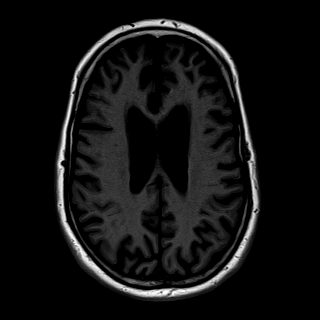
[im 28/28]
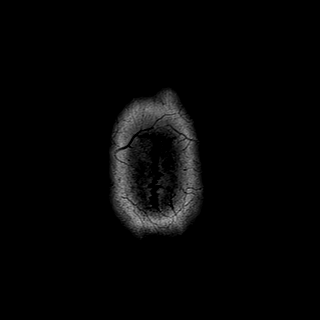

[Series 12: GRE · axial · 4.0mm · 0.45mm/px · z∈[-46,+100]mm · 4 of 28 slices shown]
[im 1/28]
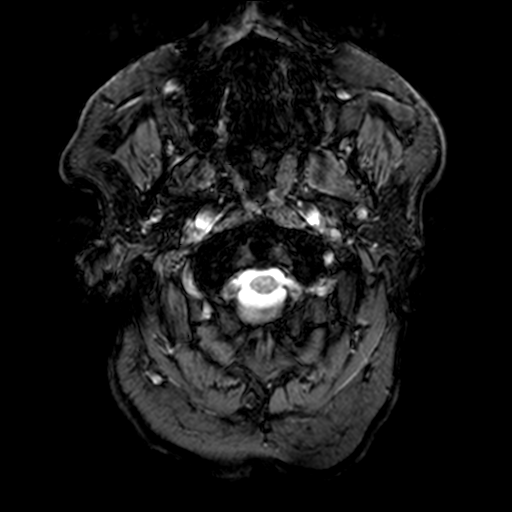
[im 10/28]
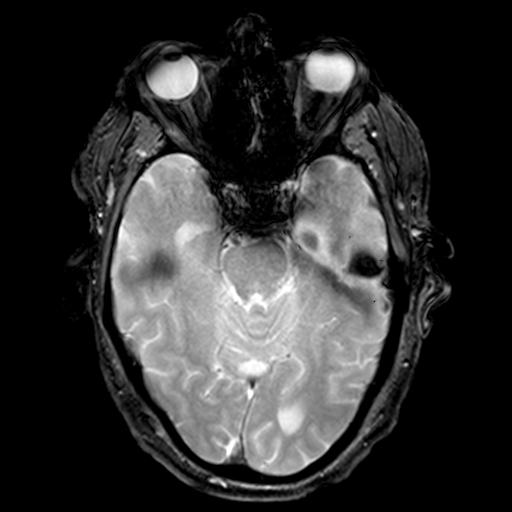
[im 19/28]
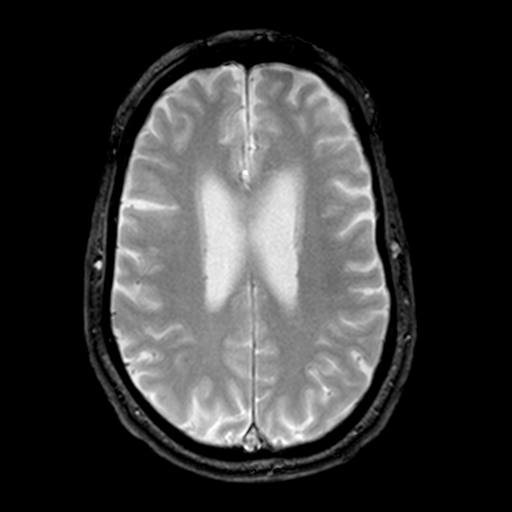
[im 28/28]
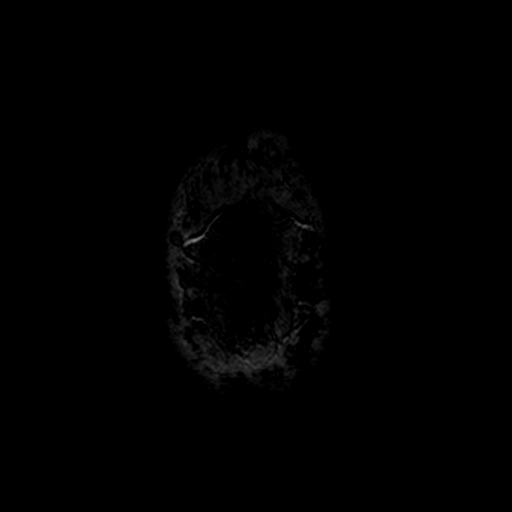

[48 of 48 positions shown; findings below may reference images not displayed]

IMPRESSION: Scattered areas nonconflicting and largely subcentimeter of hyperintensity involving frontal and parietal deep and to lesser extent subcortical white matter. Most likely etiology is microvascular disease which is mild to moderate extent for patient age.
Location
1.

## 2020-02-08 IMAGING — CT CT CHEST W CONTRAST
4 of 11 series · 9 of 30 positions shown, 10 images · IV contrast (Isovue 370)
Comparison: 02/10/2019
PROCEDURE:
Axial CT images of the chest were obtained following administration IV contrast.

CT CHEST WITH CONTRAST
INDICATION: Unspecified B-cell lymphoma, unspecified site

[Series 2: chest · axial · 0.93mm/px · z∈[-325,-25]mm · 3 of 121 slices shown, 4 images]
[im 1/121  mediastinal]
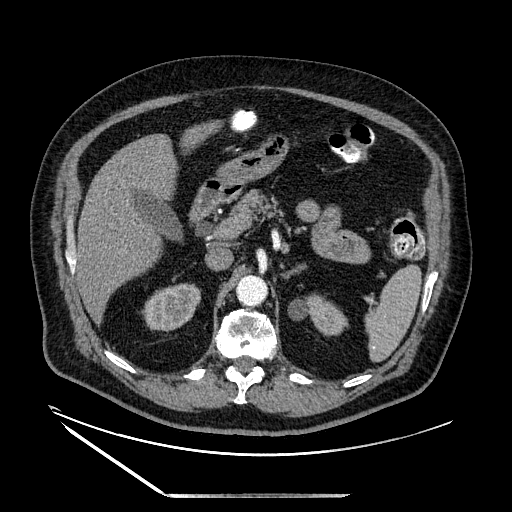
[im 1/121  lung]
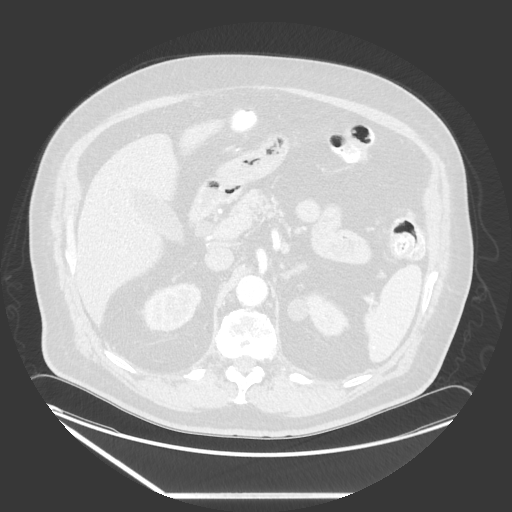
[im 20/121  lung]
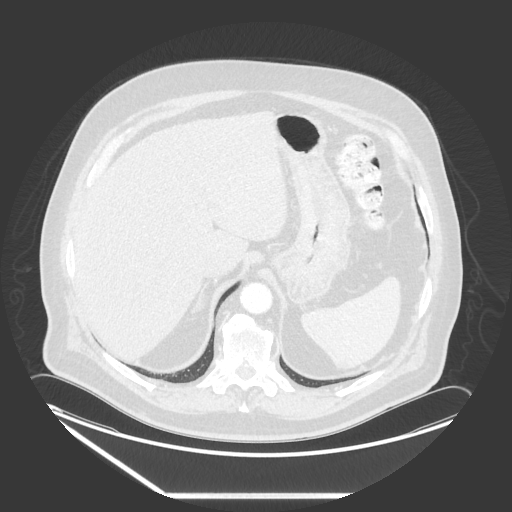
[im 121/121  lung]
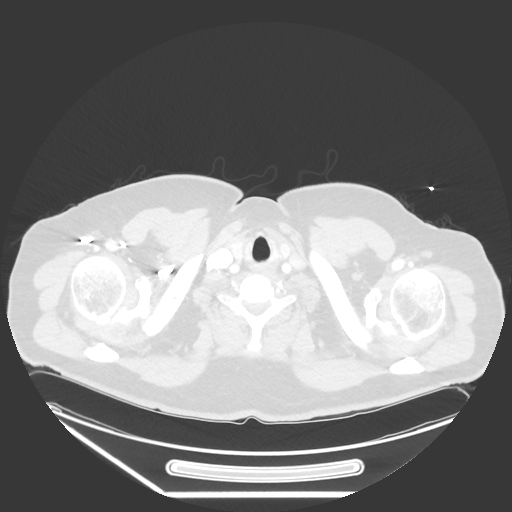

[Series 3: abdomen/pelvis · axial · 0.98mm/px · z∈[-442,-279]mm · 2 of 206 slices shown]
[im 103/206  lung]
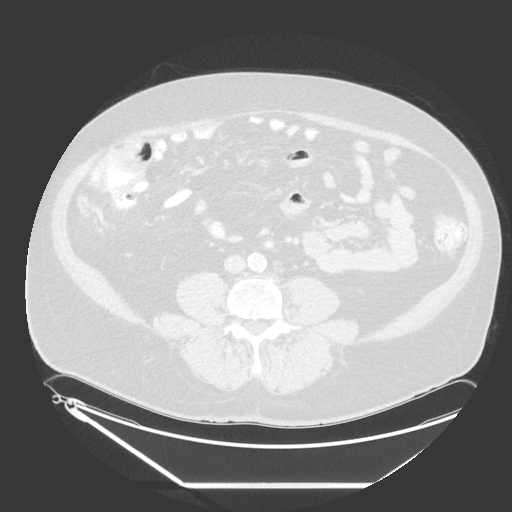
[im 168/206  lung]
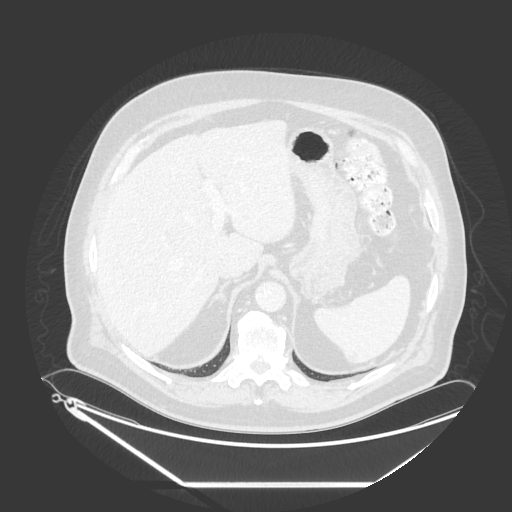

[Series 602: sagittal · sagittal · 0.93mm/px · 2 of 233 slices shown (1 of 2)]
[im 78/233  lung]
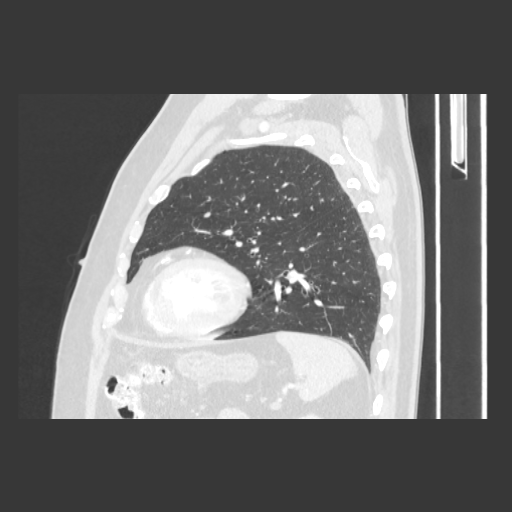
[im 155/233  lung]
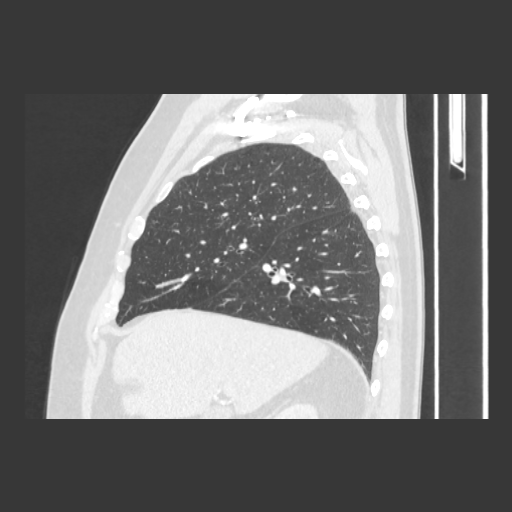

[Series 605: sagittal · sagittal · 1.00mm/px · 2 of 234 slices shown (2 of 2)]
[im 78/234  lung]
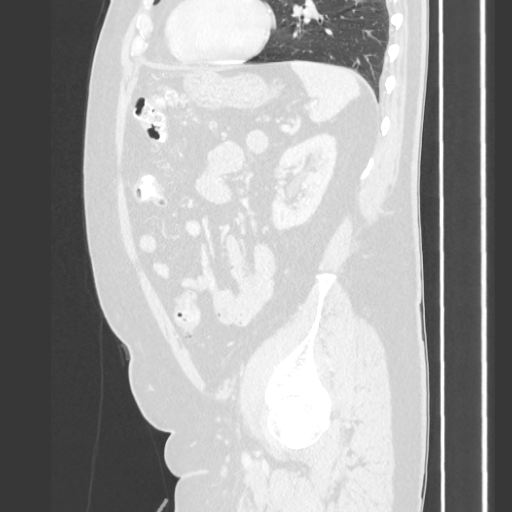
[im 156/234  lung]
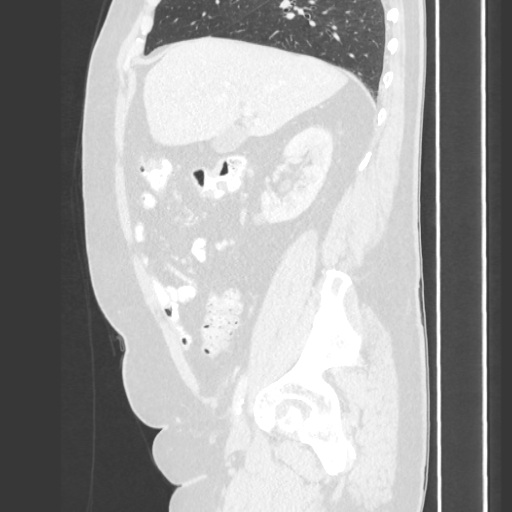

[9 of 30 positions shown; findings below may reference images not displayed]

Informed consent for contrast was obtained. Coronal and sagittal reconstruction images were generated and reviewed.
FINDINGS: Sequela prior granulomatous process redemonstrated with 3 mm calcified right lower lobe granuloma. Several calcified nonenlarged right hilar and mediastinal lymph nodes are present. No evidence for thoracic lymphadenopathy. Stable appearance of subcarinal lymph node measuring up to 9 mm in the short axis.
Heart and great vessels are normal size. No pericardial effusion. Interval placement of metallic findings in the left atrial appendage. Patent central airways. No pleural effusions or pneumothorax. Bilateral lung fields are otherwise clear.
Bones are osteopenic with diffuse thoracic spondylosis with changes of diffuse idiopathic skeletal hypertrophy.
IMPRESSION: Stable exam without evidence for thoracic adenopathy.
Prominent subcarinal lymph node and sequela of prior granulomatous process.
Location code: 1

## 2021-08-07 ENCOUNTER — Inpatient Hospital Stay: Payer: MEDICARE

## 2021-08-07 MED ORDER — PROPOFOL 10 MG/ML IV EMUL
10 mg/mL | INTRAVENOUS | Status: AC
Start: 2021-08-07 — End: ?

## 2021-08-07 MED ORDER — LIDOCAINE (PF) 20 MG/ML (2 %) IJ SOLN
20 mg/mL (2 %) | INTRAMUSCULAR | Status: DC | PRN
Start: 2021-08-07 — End: 2021-08-07
  Administered 2021-08-07: 19:00:00 via INTRAVENOUS

## 2021-08-07 MED ORDER — SODIUM CHLORIDE 0.9 % IV
INTRAVENOUS | Status: DC | PRN
Start: 2021-08-07 — End: 2021-08-07
  Administered 2021-08-07 (×2): via INTRAVENOUS

## 2021-08-07 MED ORDER — PROPOFOL 10 MG/ML IV EMUL
10 mg/mL | INTRAVENOUS | Status: DC | PRN
Start: 2021-08-07 — End: 2021-08-07
  Administered 2021-08-07 (×7): via INTRAVENOUS

## 2021-08-07 MED FILL — DIPRIVAN 10 MG/ML INTRAVENOUS EMULSION: 10 mg/mL | INTRAVENOUS | Qty: 200

## 2021-08-07 MED FILL — PROPOFOL 10 MG/ML IV EMUL: 10 mg/mL | INTRAVENOUS | Qty: 50

## 2021-08-07 NOTE — Procedures (Signed)
Procedures  by Inez Pilgrim, MD at 08/07/21 1412                Author: Inez Pilgrim, MD  Service: Gastroenterology  Author Type: Physician       Filed: 08/07/21 1753  Date of Service: 08/07/21 1412  Status: Addendum          Editor: Inez Pilgrim, MD (Physician)          Related Notes: Original Note by Inez Pilgrim, MD (Physician) filed at 08/07/21 1418            Pre-procedure Diagnoses        1. Screening for colon cancer [Z12.11]        2. 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 Screening for colon cancer [Z12.11]        2. 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 COLONOSCOPY,DIAGNOSTIC [[id number]]                              [place]   [name], [name]                       Colonoscopy Procedure Note      Indications:   See Preoperative Diagnosis above   Referring Physician: Fuller Mandril, MD   Anesthesia/Sedation: MAC anesthesia Propofol   Endoscopist:  Dr. Inez Pilgrim   Assistant:  Endoscopy Technician-1: Chucky May C   Endoscopy RN-1: Mikel Cella, RN   Preoperative diagnosis: Constipation, unspecified constipation type [K59.00]   Chronic atrial fibrillation ([place]) [I48.20]   Hypertension, unspecified type [I10]   Postoperative diagnosis: Diverticulosis      Procedure in Detail:   Informed consent was obtained for the procedure, including sedation.  Risks of perforation, hemorrhage, adverse drug reaction, and aspiration were discussed. The patient was placed in the left lateral  decubitus position.  Based on the pre-procedure assessment, including review of the patient's medical history, medications, allergies, and review of systems, he had been deemed to be an appropriate candidate for  sedation; he was therefore sedated with  the medications listed above.   The patient was monitored continuously with ECG tracing, pulse oximetry,  blood pressure monitoring, and direct observations.        A rectal examination was performed. The [id number] was inserted into the rectum and advanced under direct vision to the cecum, which was identified by the ileocecal valve and appendiceal orifice.  The quality of the colonic preparation was fair.  A careful  inspection was made as the colonoscope was withdrawn, including a retroflexed view of the rectum; findings and interventions are described below.  Appropriate photodocumentation was obtained.      Findings:   Rectum: normal   Sigmoid: mild diverticulosis; 2 mm polyp removed with cold biopsy   Descending Colon: mild diverticulosis;   Transverse Colon: normal   Ascending Colon: normal   Cecum: normal      Specimens:      Colon  polyp      EBL: None      Complications: None; patient tolerated the procedure well.      Recommendations:      - Await pathology.     - If adenoma is present, repeat colonoscopy in 5 years.            Signed By: Inez Pilgrim, MD                         August 07, 2021

## 2021-08-07 NOTE — Anesthesia Post-Procedure Evaluation (Signed)
Procedure(s):  COLONOSCOPY  ENDOSCOPIC POLYPECTOMY.    MAC    Anesthesia Post Evaluation        Patient participation: complete - patient participated  Level of consciousness: awake  Pain management: adequate  Airway patency: patent  Anesthetic complications: no  Cardiovascular status: hemodynamically stable  Respiratory status: acceptable  Hydration status: acceptable  Comments: The patient is ready for PACU discharge.  Lynn Recendiz I Robby Pirani, DO                   Post anesthesia nausea and vomiting:  controlled      INITIAL Post-op Vital signs:   Vitals Value Taken Time   BP 93/56 08/07/21 1407   Temp     Pulse 77 08/07/21 1407   Resp 14 08/07/21 1407   SpO2 97 % 08/07/21 1407

## 2021-08-07 NOTE — Progress Notes (Signed)
 Initial RN admission and assessment performed and documented in Endoscopy navigator.     Patient evaluated by anesthesia in pre-procedure holding.     All procedural vital signs, airway assessment, and level of consciousness information monitored and recorded by anesthesia staff on the anesthesia record.     Report received from CRNA post procedure.  Patient transported to recovery area by RN.    Endoscope was pre-cleaned at bedside immediately following procedure by Joe.

## 2021-08-07 NOTE — Anesthesia Pre-Procedure Evaluation (Signed)
Relevant Problems   No relevant active problems       Anesthetic History   No history of anesthetic complications            Review of Systems / Medical History  Patient summary reviewed, nursing notes reviewed and pertinent labs reviewed    Pulmonary  Within defined limits                 Neuro/Psych   Within defined limits           Cardiovascular    Hypertension                   GI/Hepatic/Renal  Within defined limits              Endo/Other  Within defined limits           Other Findings              Physical Exam    Airway  Mallampati: II  TM Distance: > 6 cm  Neck ROM: normal range of motion   Mouth opening: Normal     Cardiovascular  Regular rate and rhythm,  S1 and S2 normal,  no murmur, click, rub, or gallop             Dental  No notable dental hx       Pulmonary  Breath sounds clear to auscultation               Abdominal  GI exam deferred       Other Findings            Anesthetic Plan    ASA: 2  Anesthesia type: MAC            Anesthetic plan and risks discussed with: Patient

## 2021-08-07 NOTE — H&P (Signed)
Colonoscopy History and Physical      The patient was seen and examined.Date of last colonoscopy: 2010, Polyps  No      The airway was assessed and documented.  The problem list, past medical history, and medications were reviewed.     There is no problem list on file for this patient.    Social History     Socioeconomic History    Marital status: MARRIED     Spouse name: Not on file    Number of children: Not on file    Years of education: Not on file    Highest education level: Not on file   Occupational History    Not on file   Tobacco Use    Smoking status: Former     Packs/day: 0.25     Types: Cigarettes     Quit date: 105     Years since quitting: 45.2    Smokeless tobacco: Never   Substance and Sexual Activity    Alcohol use: Never    Drug use: Never    Sexual activity: Not on file   Other Topics Concern    Not on file   Social History Narrative    Not on file     Social Determinants of Health     Financial Resource Strain: Not on file   Food Insecurity: Not on file   Transportation Needs: Not on file   Physical Activity: Not on file   Stress: Not on file   Social Connections: Not on file   Intimate Partner Violence: Not on file   Housing Stability: Not on file     Past Medical History:   Diagnosis Date    Hypertension     Psychiatric disorder     PTSD (post-traumatic stress disorder) 1970         Prior to Admission Medications   Prescriptions Last Dose Informant Patient Reported? Taking?   Omeprazole delayed release (PRILOSEC D/R) 20 mg tablet   Yes No   Sig: Take 20 mg by mouth daily.   acetaminophen (TYLENOL) 500 mg tablet 08/07/2021 at 8am  Yes Yes   Sig: Take 1,000 mg by mouth two (2) times a day.   brimonidine (ALPHAGAN) 0.2 % ophthalmic solution 08/06/2021  Yes Yes   Sig: Apply  to eye every eight (8) hours.   diphenhydrAMINE (BENADRYL) 25 mg capsule Not Taking  Yes No   Sig: Take 50 mg by mouth nightly as needed.   Patient not taking: Reported on 08/07/2021   docusate sodium (COLACE) 100 mg capsule  08/06/2021  Yes Yes   Sig: Take 100 mg by mouth daily.   dutasteride (AVODART) 0.5 mg capsule   Yes No   Sig: Take 0.5 mg by mouth daily.   glucosam/chond-msm1/C/mang/bor (GLUCOSAM-CHOND-MSM,WITH BORON, PO) 07/31/2021  Yes Yes   Sig: Take 2 Tabs by mouth daily.   hydroCHLOROthiazide (HYDRODIURIL) 25 mg tablet 08/06/2021  Yes Yes   Sig: Take 25 mg by mouth daily.   latanoprost (XALATAN) 0.005 % ophthalmic solution 08/07/2021  Yes Yes   Sig: Administer 1 Drop to both eyes nightly.   losartan (COZAAR) 100 mg tablet 08/06/2021  Yes Yes   Sig: Take 100 mg by mouth daily.   metoprolol tartrate (LOPRESSOR) 25 mg tablet 08/06/2021  No Yes   Sig: Take 1 Tab by mouth two (2) times a day.   multivitamin, tx-iron-ca-min (THERA-M W/ IRON) 9 mg iron-400 mcg tab tablet 08/06/2021  Yes Yes  Sig: Take 1 Tab by mouth daily.   tamsulosin (FLOMAX) 0.4 mg capsule 08/06/2021  Yes Yes   Sig: Take 0.4 mg by mouth daily.      Facility-Administered Medications: None       The patient was seen and examined in the endoscopy suite. The airway was assessed and documented. The problem list and medications were reviewed.     Chief complaint, history of present illness, and review of systems and Past medical History are positive for: constipation and colon cancer screening    The heart, lungs and mental status were satisfactory for the administration of sedation and for the procedure.     I discussed with the patient the objectives, risks, consequences and alternatives to the procedure.     The patient was counseled at length about the risks of contracting Covid-19 in the peri-operative and post-operative states including the recovery window of their procedure.  The patient was made aware that contracting Covid-19 after a surgical procedure may worsen their prognosis for recovering from the virus and lend to a higher morbidity and or mortality risk.  The patient was given the options of postponing their procedure. All of the risks, benefits, and alternatives  were discussed. The patient does  wish to proceed with the procedure.    Plan: Endoscopic procedure with sedation     Alma Downs, MD   08/07/2021  1:47 PM

## 2021-08-23 IMAGING — CR XR CHEST 1 VIEW
1 series · 1 of 1 positions shown · non-contrast
Comparison: Radiographs of the chest 06/02/2019

FINAL REPORT:
Study:XR CHEST 1 VIEW
HISTORY: Chest pain

[AP]
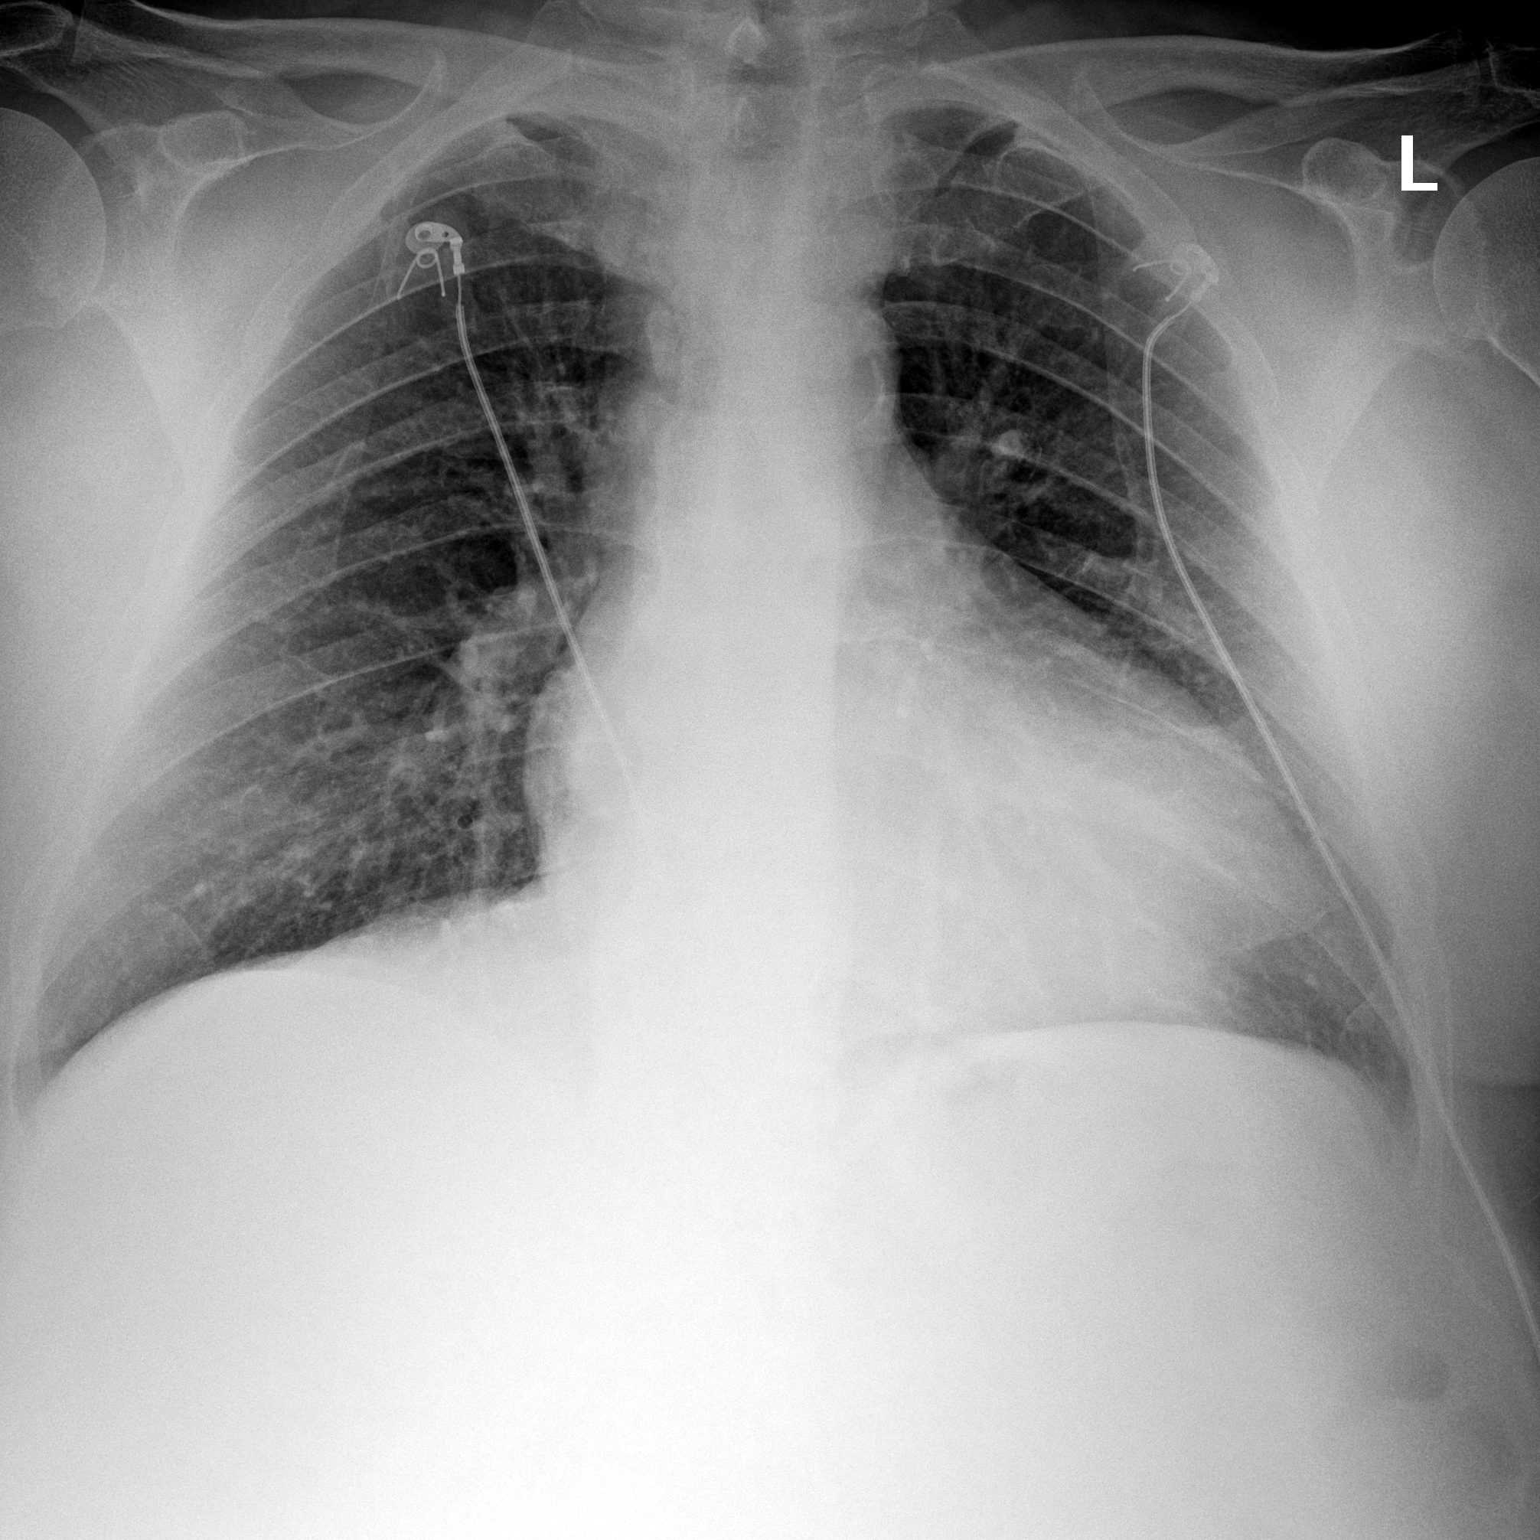

[1 of 1 positions shown; findings below may reference images not displayed]

FINDINGS: The cardiac silhouette is within normal limits.
There is no focal airspace consolidation, pleural effusion, pulmonary edema, or pneumothorax.
No acute osseous abnormality identified.
IMPRESSION: 
IMPRESSION: No acute pulmonary process identified.
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
## 2021-12-19 IMAGING — CT CT CHEST WITH CONTRAST
2 of 3 series · 15 of 36 positions shown, 18 images · IV contrast (agent unspecified)
Comparison: 02/08/2020.

FINAL REPORT:
EXAM: CT CHEST WITH CONTRAST
INDICATION: Small cell B-cell lymphoma, unspecified site
TECHNIQUE: CT chest with IV contrast using multiplanar reconstructions.
All CT scans at this facility use dose modulation and/or weight based dosing when appropriate to reduce radiation dose to as low as reasonably achievable. (#[id number]#)

[Series 301: chest with · axial · 0.86mm/px · z∈[-349,-52]mm · 12 of 141 slices shown, 15 images]
[im 11/141  mediastinal]
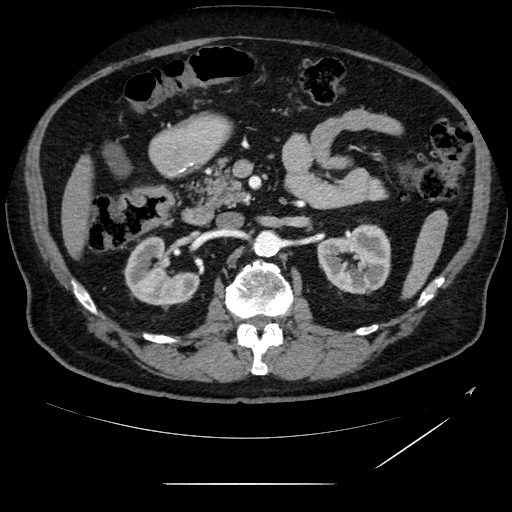
[im 11/141  lung]
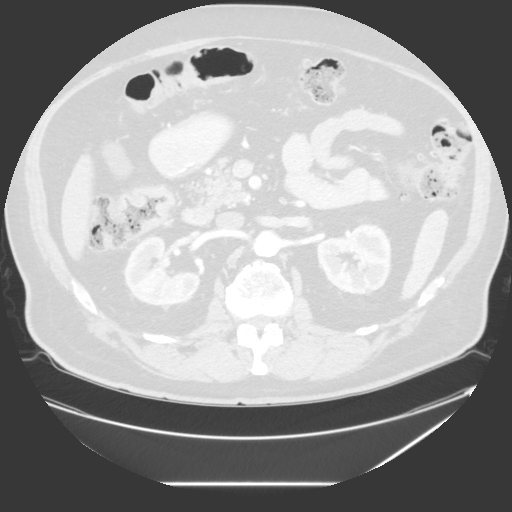
[im 21/141  lung]
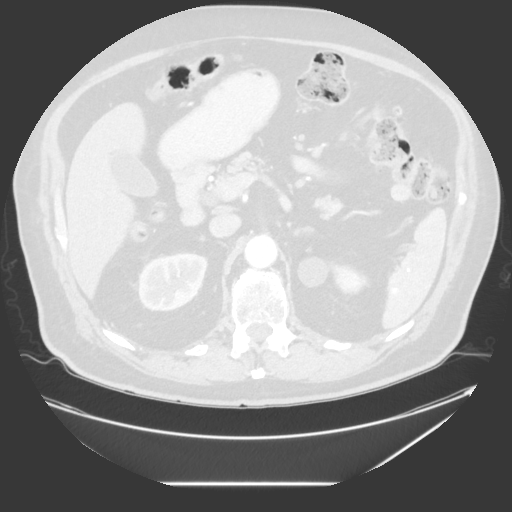
[im 32/141  lung]
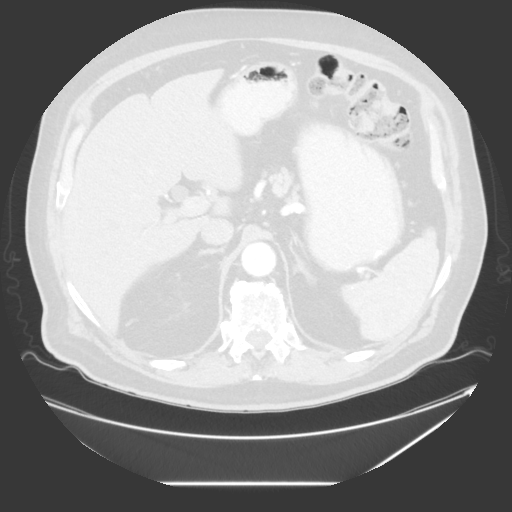
[im 42/141  lung]
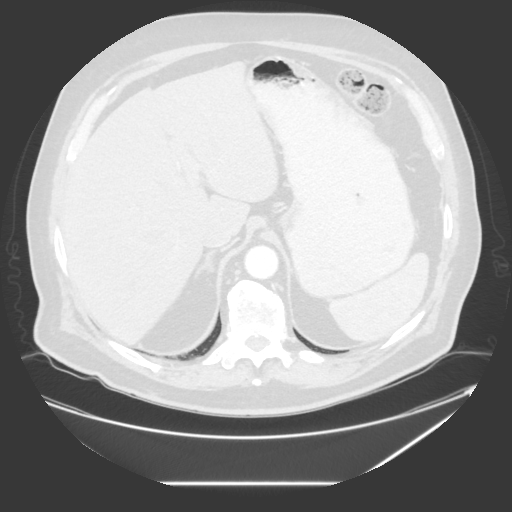
[im 52/141  mediastinal]
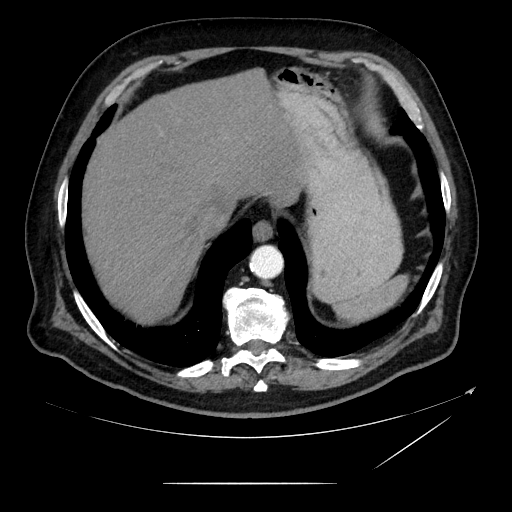
[im 52/141  lung]
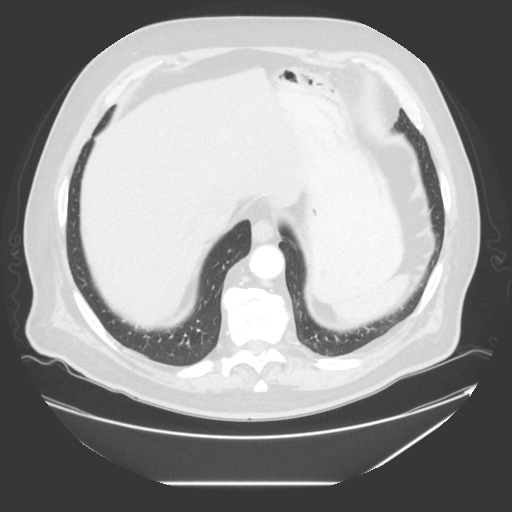
[im 63/141  lung]
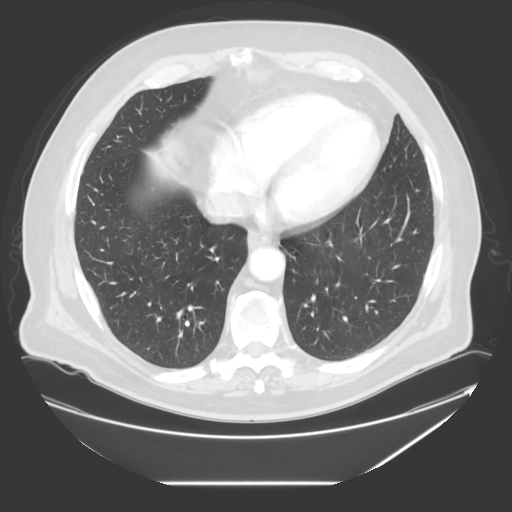
[im 78/141  lung]
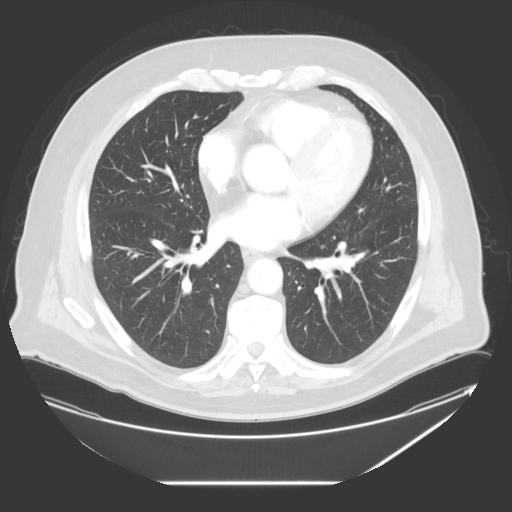
[im 89/141  lung]
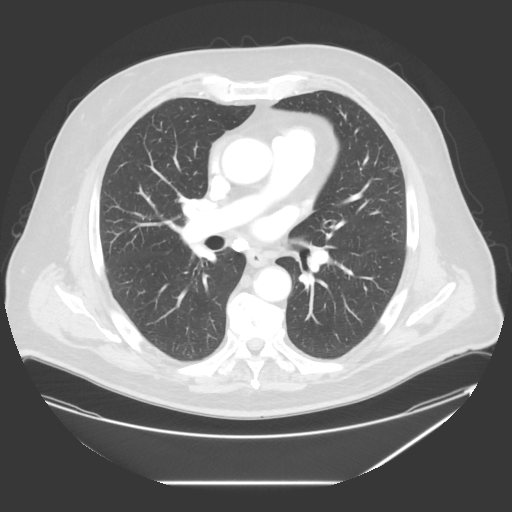
[im 99/141  mediastinal]
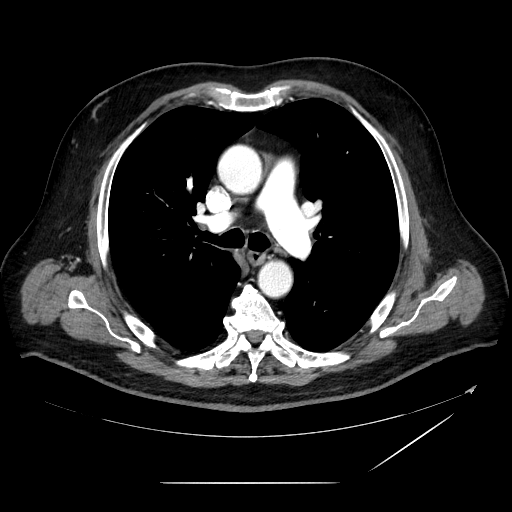
[im 99/141  lung]
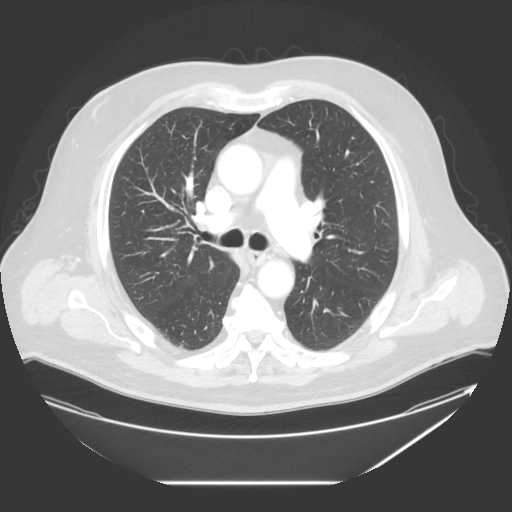
[im 109/141  lung]
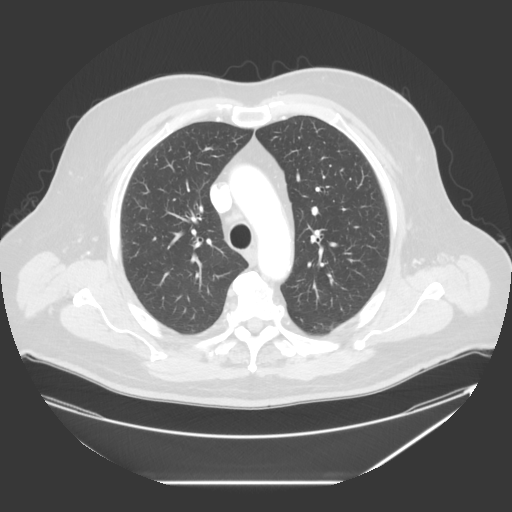
[im 120/141  lung]
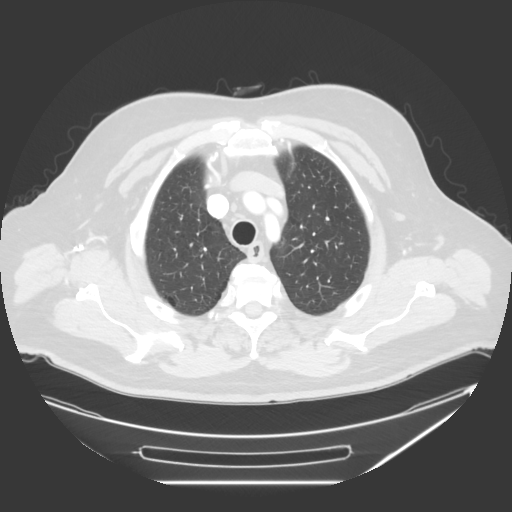
[im 130/141  lung]
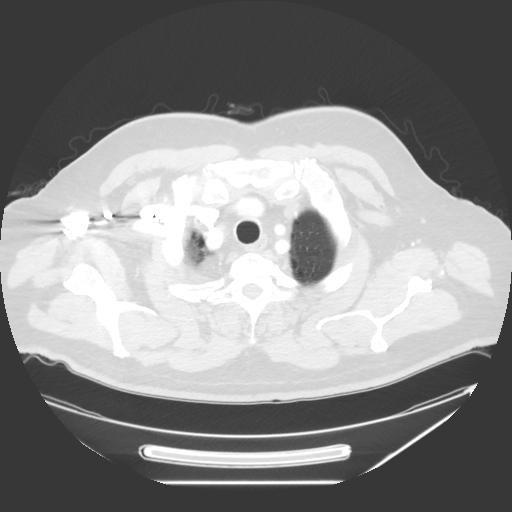

[Series 601: coronal · coronal · 0.86mm/px · 3 of 209 slices shown]
[im 42/209  lung]
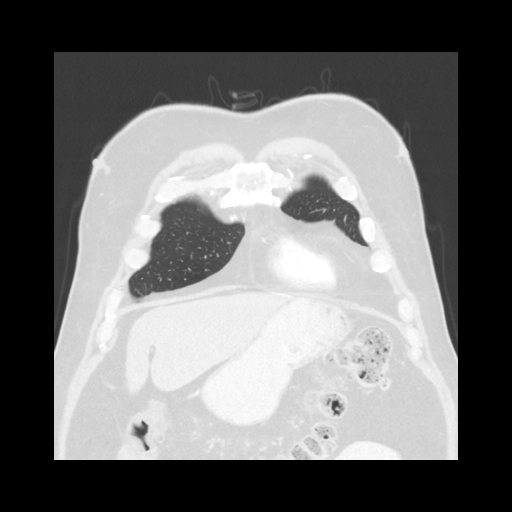
[im 84/209  lung]
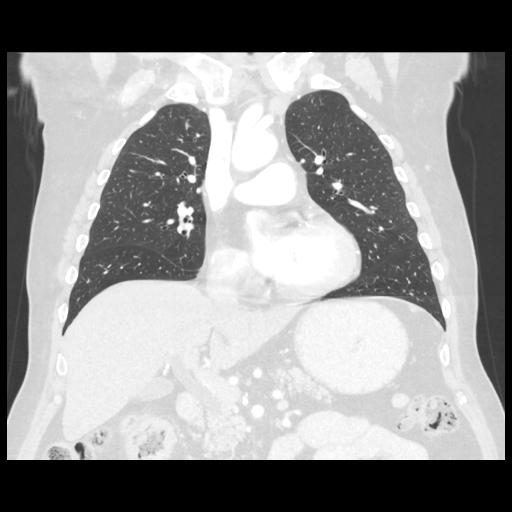
[im 125/209  lung]
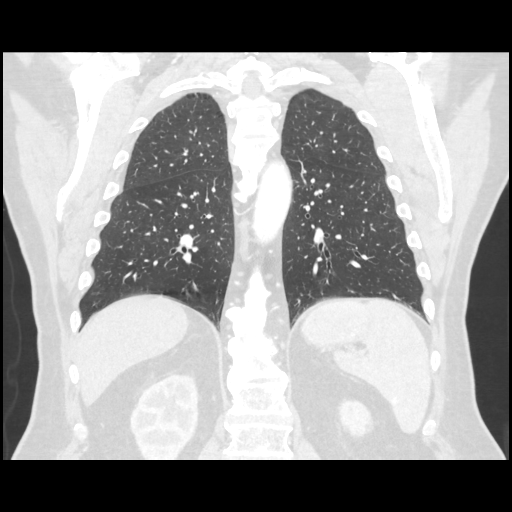

[15 of 36 positions shown; findings below may reference images not displayed]

FINDINGS: Neck findings are reported separately.
HEART: Coronary artery calcifications. Left atrial appendage occlusion device.
VASCULATURE: Mild arterial calcinosis.
MEDIASTINUM \T\ HILA: No new or enlarging lymphadenopathy.
CHEST WALL \T\ AXILLA: Bilateral gynecomastia.
AIRWAYS \T\ LUNGS: Central airways are patent. Mild emphysema.
BONES: Degenerative changes throughout the visualized spine.
UPPER ABDOMEN: Reported separately.
IMPRESSION: 1.  No evidence of intrathoracic metastatic disease.
2.  No acute intrathoracic findings.

## 2021-12-19 IMAGING — CT CT SOFT TISSUE NECK WITH CONTRAST
3 series · 16 of 33 positions shown, 19 images · IV contrast (agent unspecified)
Comparison: 02/08/2020

FINAL REPORT:
EXAM: CT SOFT TISSUE NECK WITH CONTRAST
INDICATION: Small cell B-cell lymphoma, unspecified site
TECHNIQUE: CT neck with IV contrast using multiplanar reconstructions.
All CT scans at this facility use dose modulation and/or weight based dosing when appropriate to reduce radiation dose to as low as reasonably achievable. (#[id number]#)

[Series 3: neck with · axial · 0.51mm/px · z∈[-35,+185]mm · 8 of 104 slices shown, 10 images]
[im 8/104  soft-tissue]
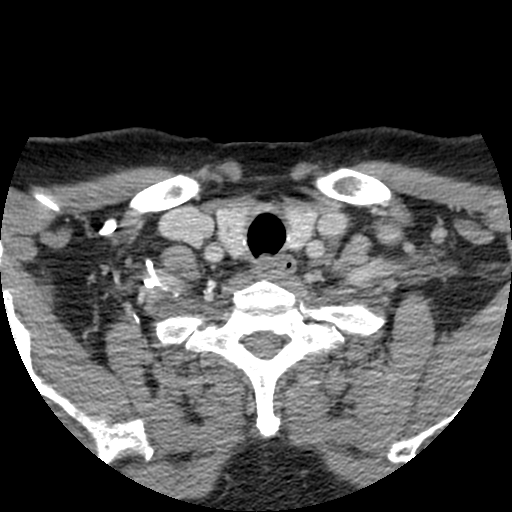
[im 8/104  bone]
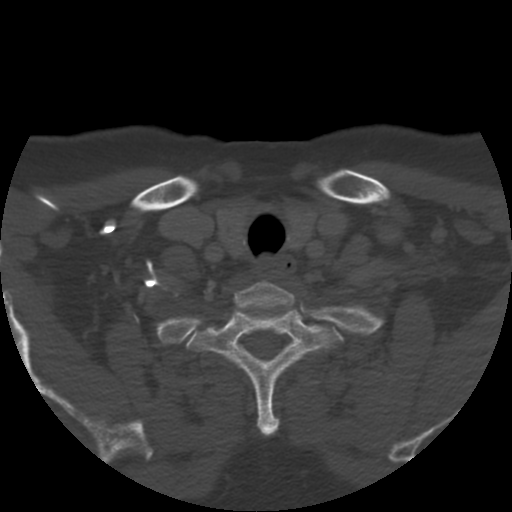
[im 24/104  bone]
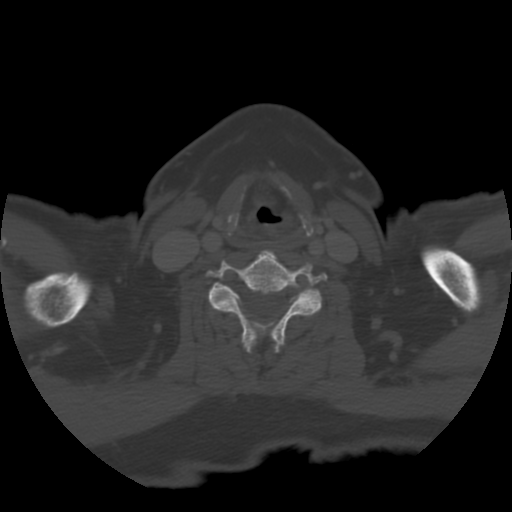
[im 32/104  bone]
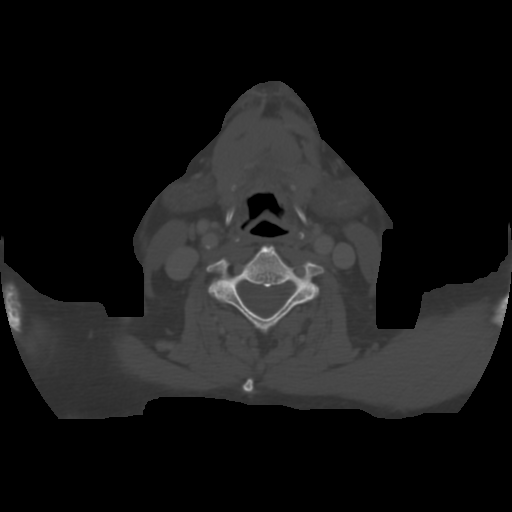
[im 48/104  bone]
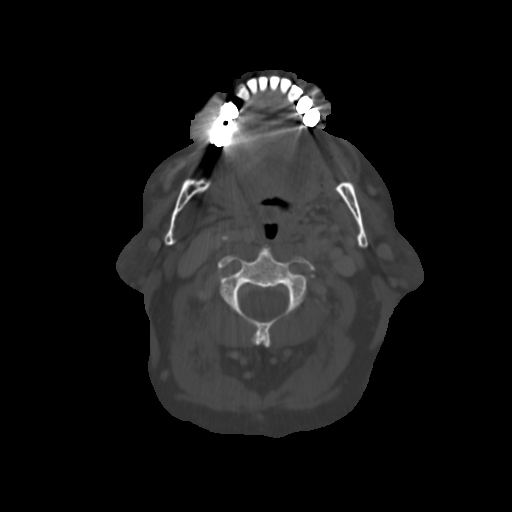
[im 56/104  soft-tissue]
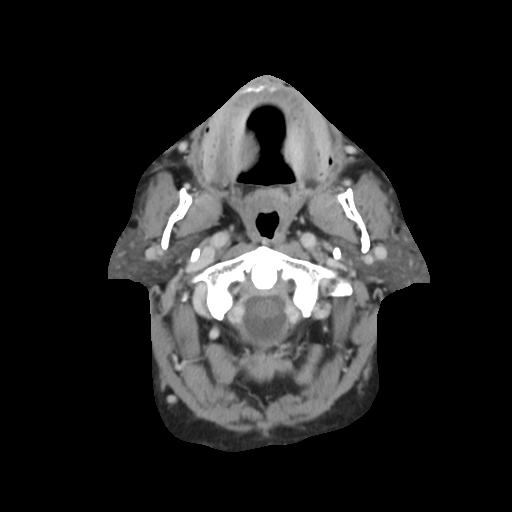
[im 56/104  bone]
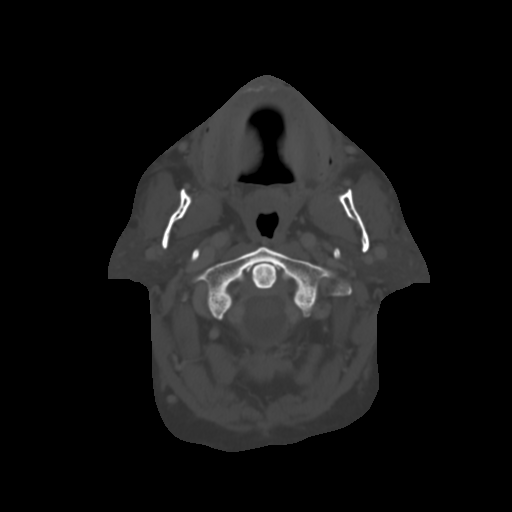
[im 72/104  bone]
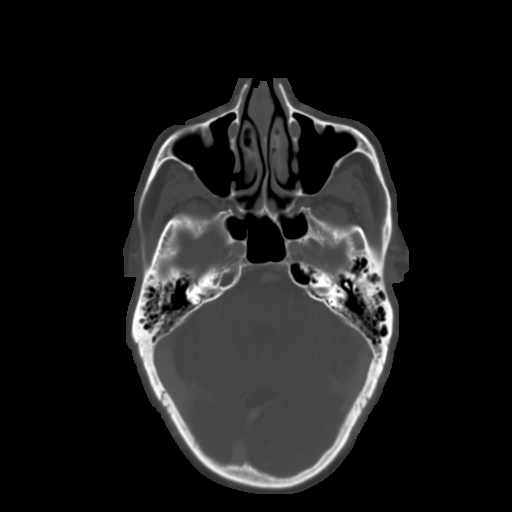
[im 80/104  bone]
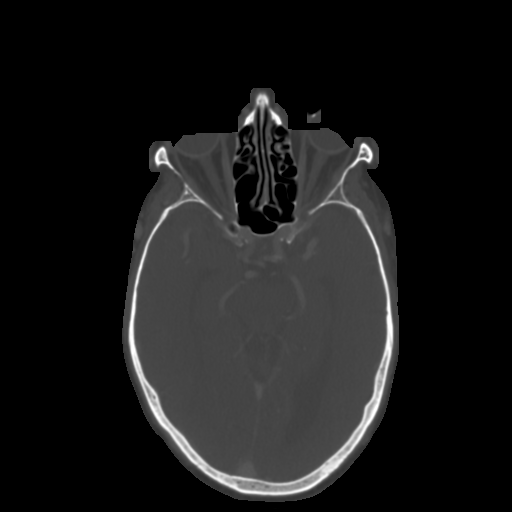
[im 96/104  bone]
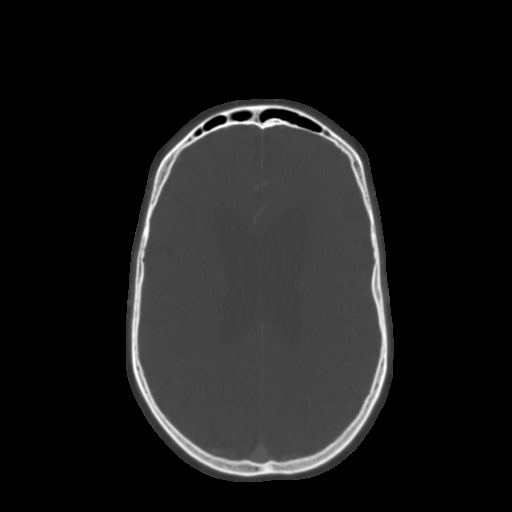

[Series 605: cor · coronal · 0.51mm/px · 3 of 125 slices shown]
[im 25/125  bone]
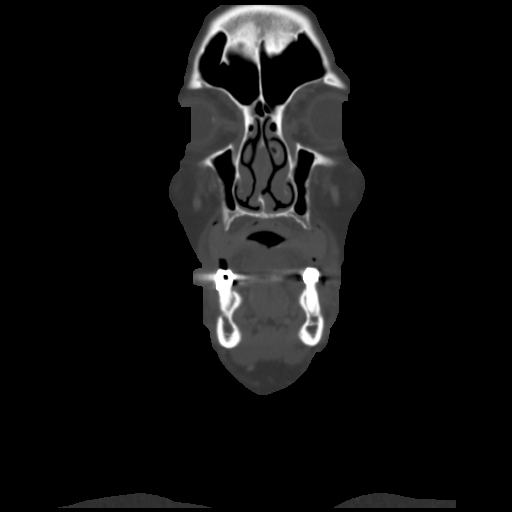
[im 50/125  bone]
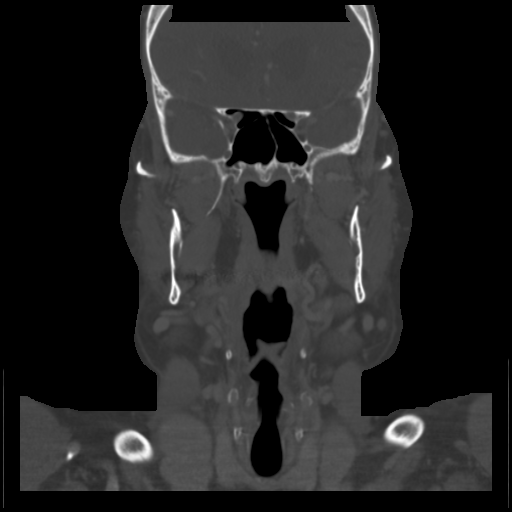
[im 75/125  bone]
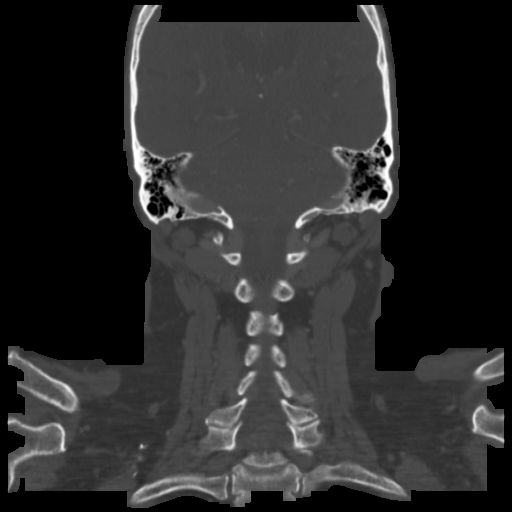

[Series 606: sag · sagittal · 0.51mm/px · 5 of 132 slices shown, 6 images]
[im 44/132  bone]
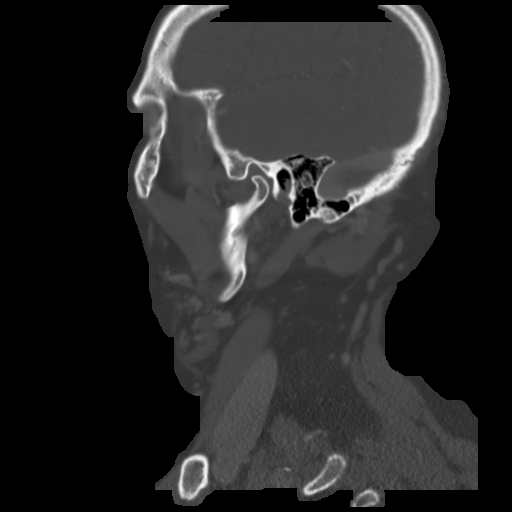
[im 55/132  bone]
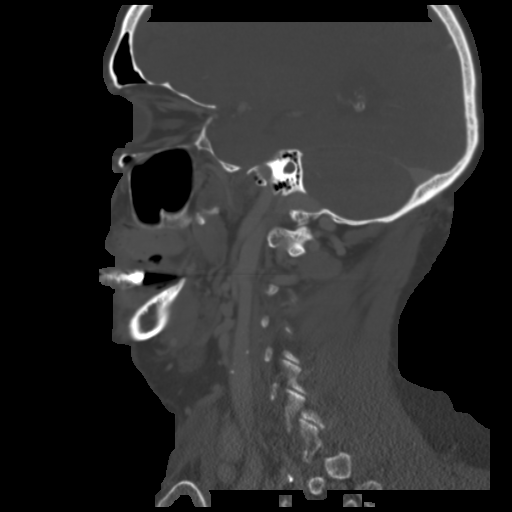
[im 66/132  soft-tissue]
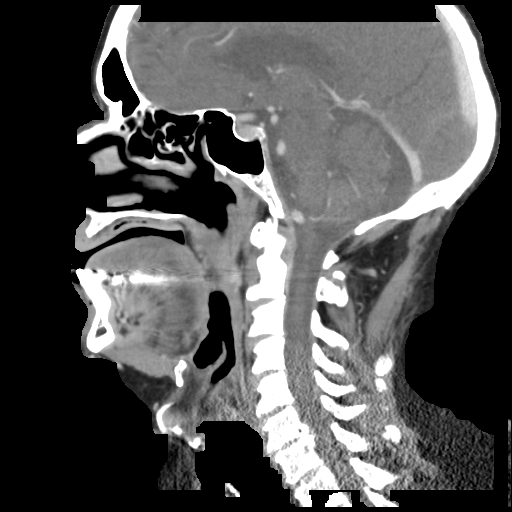
[im 66/132  bone]
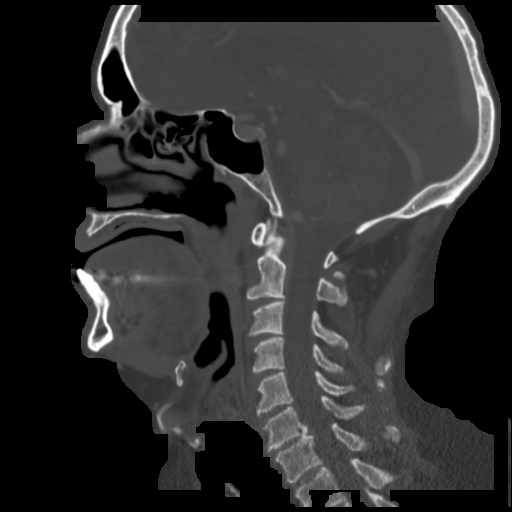
[im 77/132  bone]
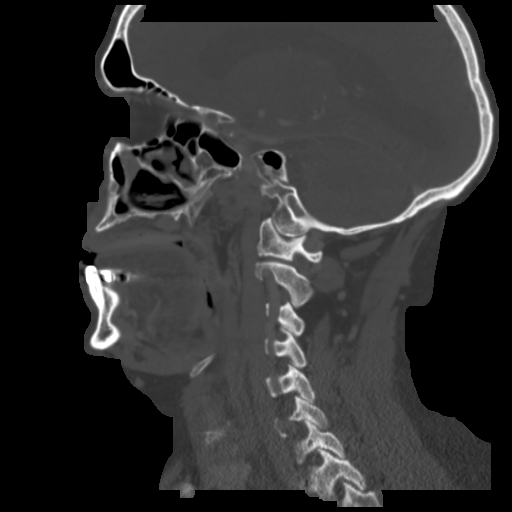
[im 88/132  bone]
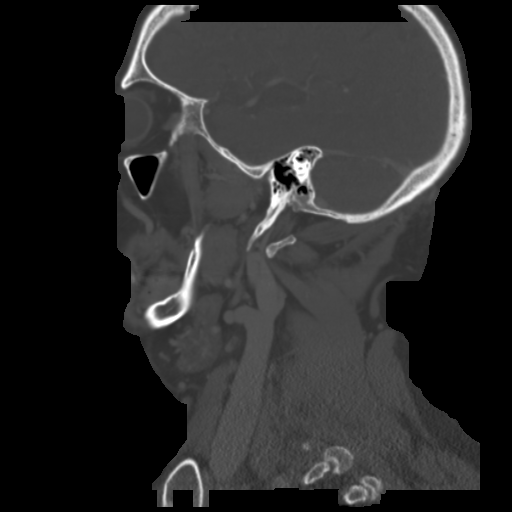

[16 of 33 positions shown; findings below may reference images not displayed]

FINDINGS: The visualized portions of the brain appear normal. Status post lens extraction.
The hypopharynx, larynx and subglottic airway are normal.
No enlarged lymph nodes.
Thyroid gland is unremarkable.
Mild arterial calcinosis.
Vertebral bodies are normal in height and alignment.
Chest findings reported separately.
IMPRESSION: No evidence of metastatic disease in the neck.

## 2021-12-19 IMAGING — CT CT ABDOMEN PELVIS WITH CONTRAST
2 of 3 series · 16 of 46 positions shown, 18 images · IV contrast (agent unspecified)
Comparison: 02/08/2020

FINAL REPORT:
EXAM: CT ABDOMEN PELVIS WITH CONTRAST
INDICATION: Small cell B-cell lymphoma, unspecified site
TECHNIQUE: CT abdomen and pelvis with IV contrast, with multiplanar reconstructions.
All CT scans at this facility use dose modulation and/or weight based dosing when appropriate to reduce radiation dose to as low as reasonably achievable. (#[id number]#)
Unless the patient's specific circumstances suggest otherwise, any liver lesion 0.5 cm or less, any cystic kidney lesion less than 1.0 cm, and/or any adrenal lesion 1.0 cm or less not otherwise characterized in this report as processing suspicious or indeterminate imaging features is/are most likely to be benign and do not require follow-up imaging or biopsy.

[Series 304: abd pel with · axial · 0.95mm/px · z∈[-659,-224]mm · 13 of 200 slices shown, 15 images]
[im 13/200  soft-tissue]
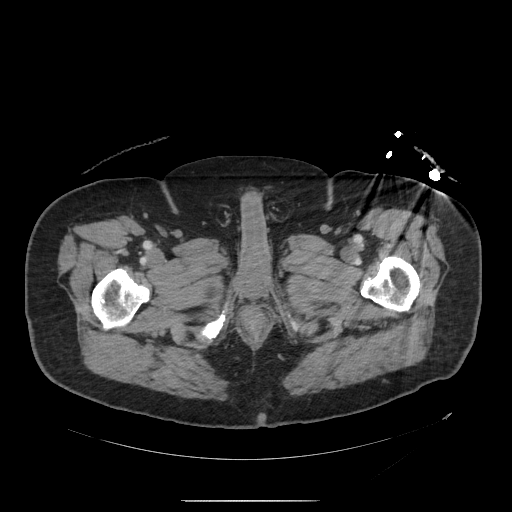
[im 13/200  bone]
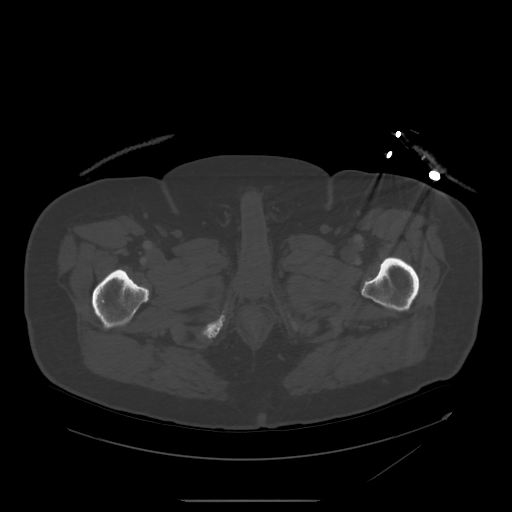
[im 26/200  soft-tissue]
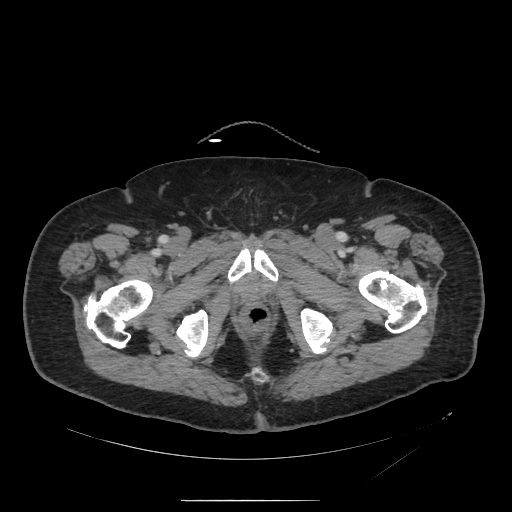
[im 39/200  soft-tissue]
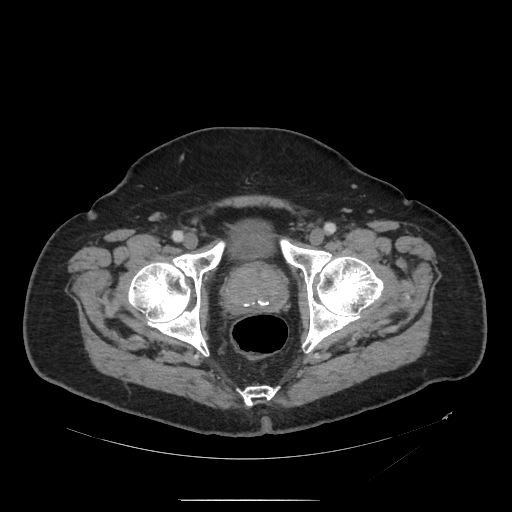
[im 58/200  soft-tissue]
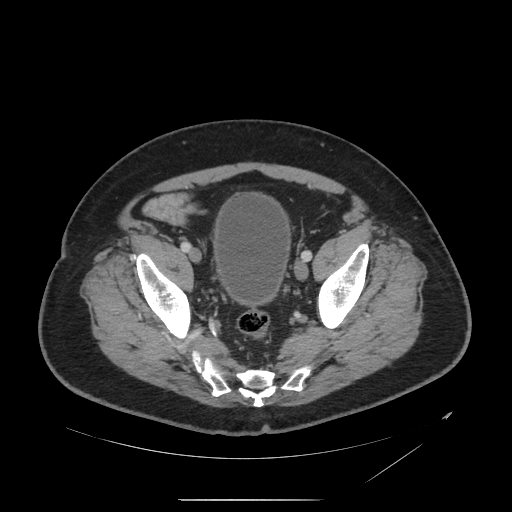
[im 71/200  soft-tissue]
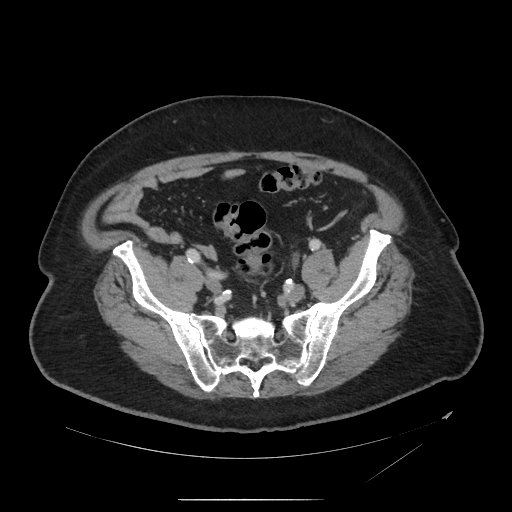
[im 84/200  soft-tissue]
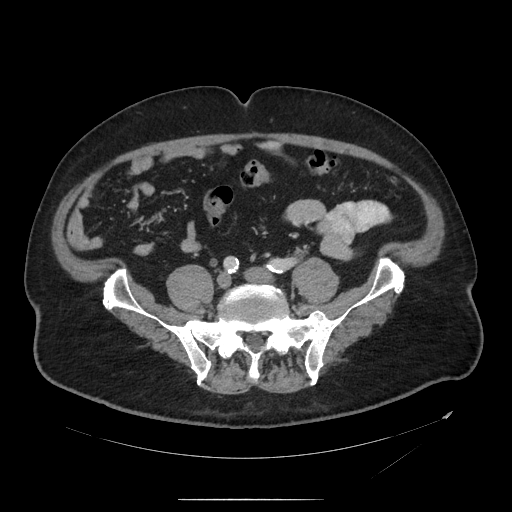
[im 103/200  soft-tissue]
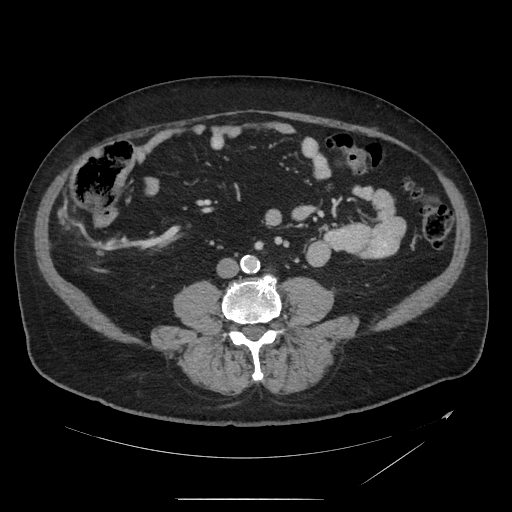
[im 116/200  soft-tissue]
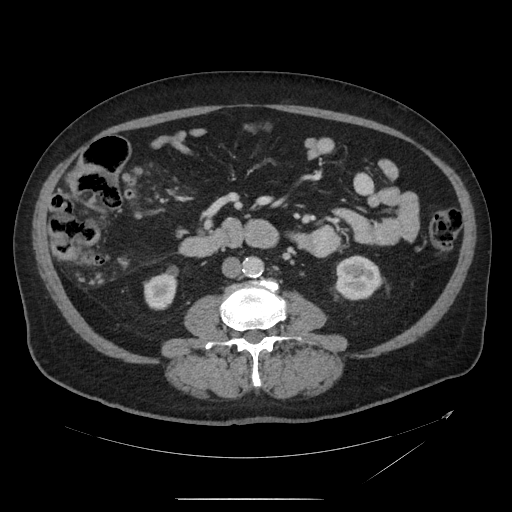
[im 129/200  soft-tissue]
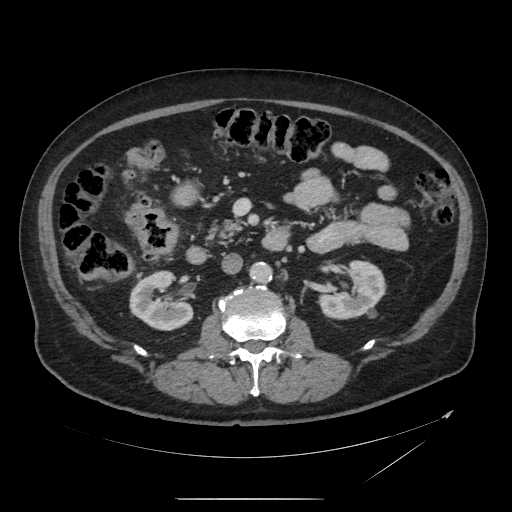
[im 129/200  bone]
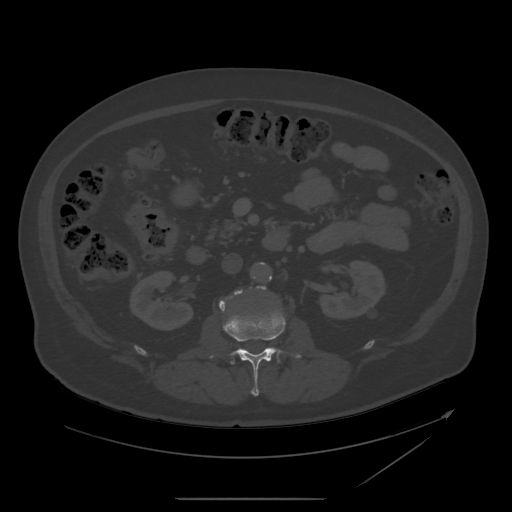
[im 142/200  soft-tissue]
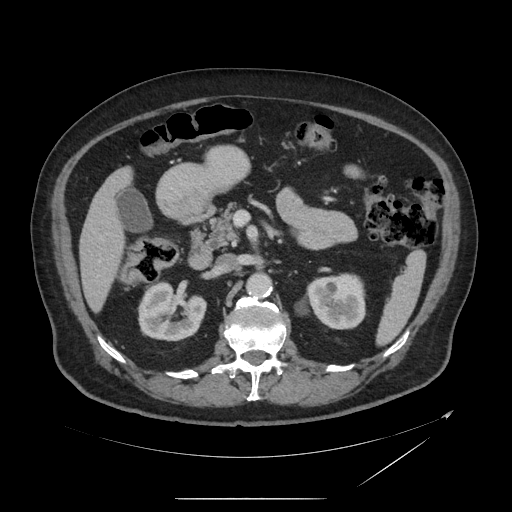
[im 161/200  soft-tissue]
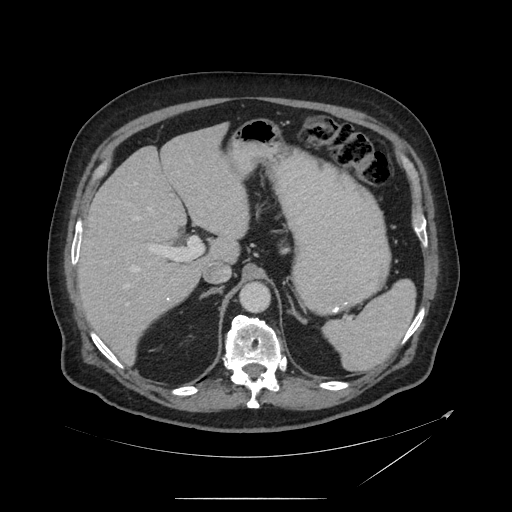
[im 174/200  soft-tissue]
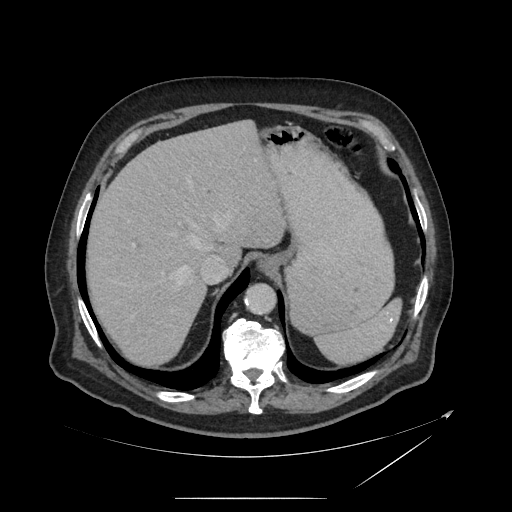
[im 187/200  soft-tissue]
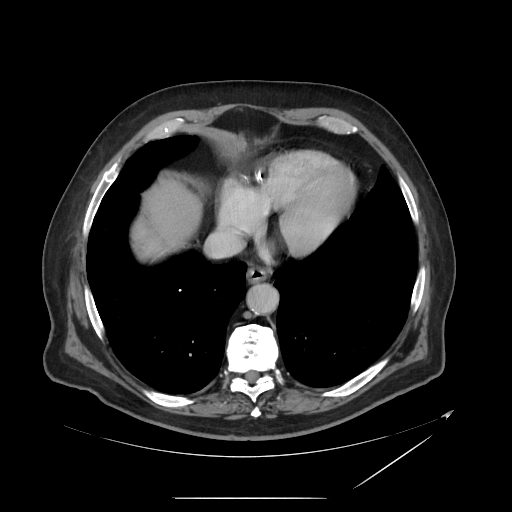

[Series 603: coronal · coronal · 0.97mm/px · 3 of 218 slices shown]
[im 73/218  soft-tissue]
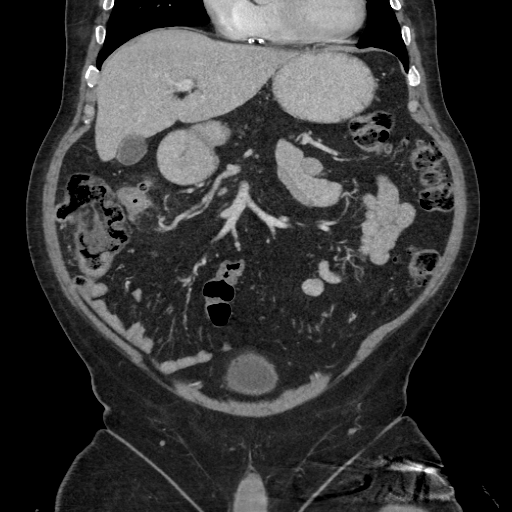
[im 97/218  soft-tissue]
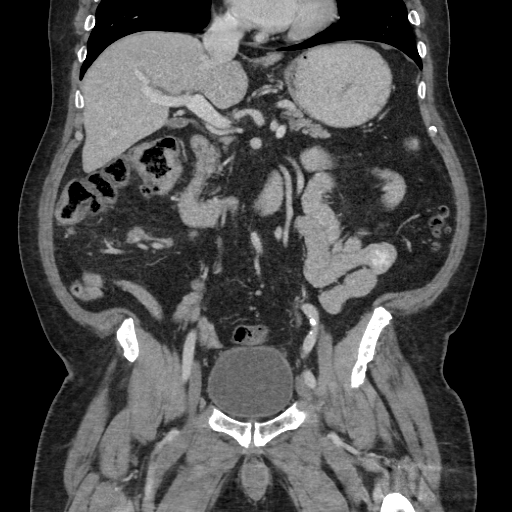
[im 121/218  soft-tissue]
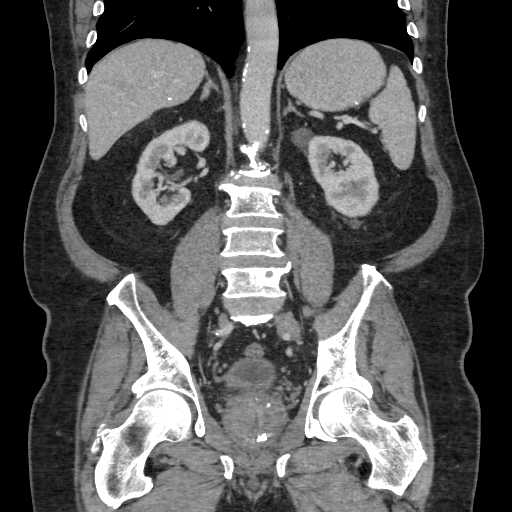

[16 of 46 positions shown; findings below may reference images not displayed]

FINDINGS: LOWER CHEST: Reported separately.
LIVER: Unremarkable.
BILIARY TREE: Unremarkable.
SPLEEN: Unremarkable.
PANCREAS: Unremarkable.
ADRENAL GLANDS: Unremarkable.
KIDNEYS/URETERS/BLADDER: 2.4 cm cyst in the superior pole of the left kidney. Subcentimeter low density foci are too small to characterize. No hydronephrosis. Urinary bladder is unremarkable.
GENITAL STRUCTURES: Prostatomegaly.
GASTROINTESTINAL TRACT: Circumferential colonic wall thickening involving the proximal transverse colon (series 304, image [id number]). Diverticulosis. No obstruction. Normal appendix.
LYMPHATICS: Clustered nonenlarged to borderline lymph nodes in the right upper and lower quadrants, a representative mesenteric lymph node measuring 2.0 x 1.2 cm (series 304, image 92).
VASCULATURE: Mild arterial calcinosis.
ABDOMINAL CAVITY: Unremarkable.
ABDOMINAL WALL/SOFT TISSUES: Unremarkable.
BONES: Degenerative changes throughout the visualized spine.
IMPRESSION: 1.  Circumferential wall thickening and stranding involving the proximal transverse colon. Correlate for sigmoid diverticulitis. Endoscopy is recommended after the resolution of the acute symptoms to ensure there is no underlying mass.
2.  Clustered nonenlarged to borderline lymph nodes throughout the right hemiabdomen are favored to be reactive. Attention on follow-up imaging is strongly recommended to ensure these do not persist, as metastatic disease cannot be excluded.

## 2022-03-07 IMAGING — PT PET^TMC_SKULL_THIGH (Adult)
1 of 7 series · 2 of 16 positions shown, 3 images · non-contrast
Comparison: CT of the chest abdomen and pelvis and neck from 12/19/2021, MRI pelvis from 12/18/2021, PET/CT from 10/05/2013

FINAL REPORT:
INDICATION: Malignant neoplasm of prostate Initial treatment strategy
Exam: PSMA PET/CT utilizing 19.67 mCi of F-18 pylarify administered via the right antecubital fossa IV. Axial CT images from the mid skull to the upper thighs  was obtained for attenuation correction and anatomic localization. These images and 3-D reconstructions were performed on an independent workstation.

[Series 4: ct wb · axial · 0.98mm/px · z∈[-814,-432]mm · 2 of 573 slices shown, 3 images]
[im 191/573  soft-tissue]
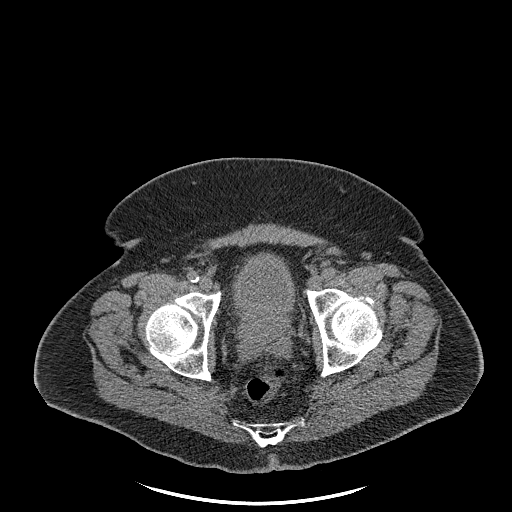
[im 191/573  bone]
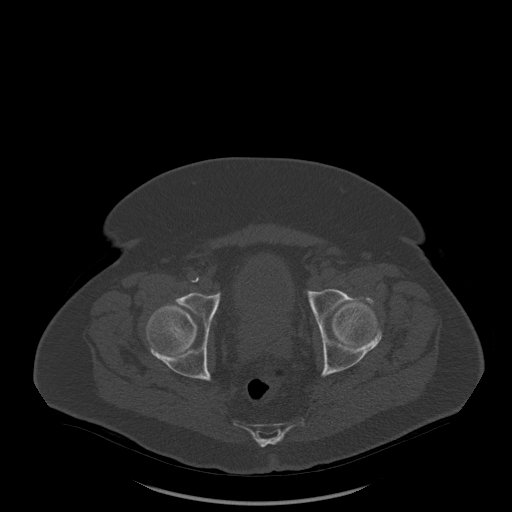
[im 382/573  soft-tissue]
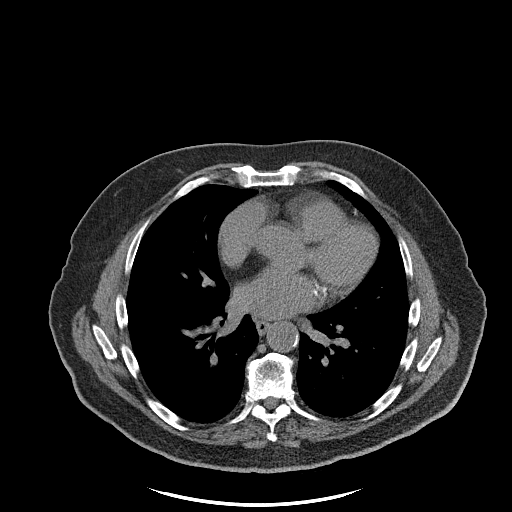

[2 of 16 positions shown; findings below may reference images not displayed]

FINDINGS: PET/CT:
Hypermetabolic focus posterior left lateral peripheral zone correlating to the areas seen on MRI measures 1.7 x 1.2 cm with max SUV of 6.7.
The background hepatic uptake measures up to 6.3 Max SUV.
CT head and neck:
Visualized intracranial structures: Unremarkable.
Orbits, paranasal sinuses, and skull base: Unremarkable.
Nasopharynx: Unremarkable.
Suprahyoid neck: Unremarkable oropharynx, oral cavity, parapharyngeal space, and retropharyngeal space.
Infrahyoid neck: Normal larynx, hypopharynx, and supraglottis.
Thyroid: Unremarkable.
Thoracic inlet: Lung apices are unremarkable.
Lymph nodes: No suspicious cervical adenopathy.
Vascular structures: Unremarkable.
CT thorax:
Lung parenchyma: Unremarkable.
Pleural effusion: None.
Central airways: Patent
Adenopathy: No suspicious axillary, hilar, or mediastinal adenopathy.
Heart: Unremarkable. No pericardial effusion.
CT abdomen/pelvis:
Gallbladder and biliary tree: Unremarkable.
Liver: Unremarkable.
Adrenal glands: Unremarkable.
Kidneys and ureters: Unremarkable.
Pancreas: Unremarkable.
Spleen: Unremarkable.
Vessels: The abdominal aorta is nonaneurysmal.
Peritoneum: No pneumoperitoneum or free intraperitoneal fluid collections.
Bowel: Unremarkable.
Bladder: Unremarkable.
Reproductive organs: Mild prostate hypertrophy.
Lymph nodes
Retroperitoneal: Unremarkable.
Mesenteric: Unremarkable.
Pelvic: Unremarkable.
Abdominal wall: Unremarkable.
Bones: Unremarkable.
IMPRESSION: 
IMPRESSION: Radiotracer uptake in the left peripheral zone correlating to the PI-RADS 4 lesion seen on recent MRI. No abnormal uptake to correlate to the PI-RADS 3 lesion seen on MRI. No abnormal radiotracer uptake to suggest distant metastatic disease.

## 2022-03-29 IMAGING — CR XR CHEST 1 VIEW
1 series · 2 of 2 positions shown · non-contrast
Comparison: 08/23/2021

Chest pain
FINAL REPORT:
XR CHEST 1 VIEW
INDICATION: Chest pain.
TECHNIQUE: AP views of the chest.

[Series 4980: AP · 2 of 2 slices shown]
[im 1/2]
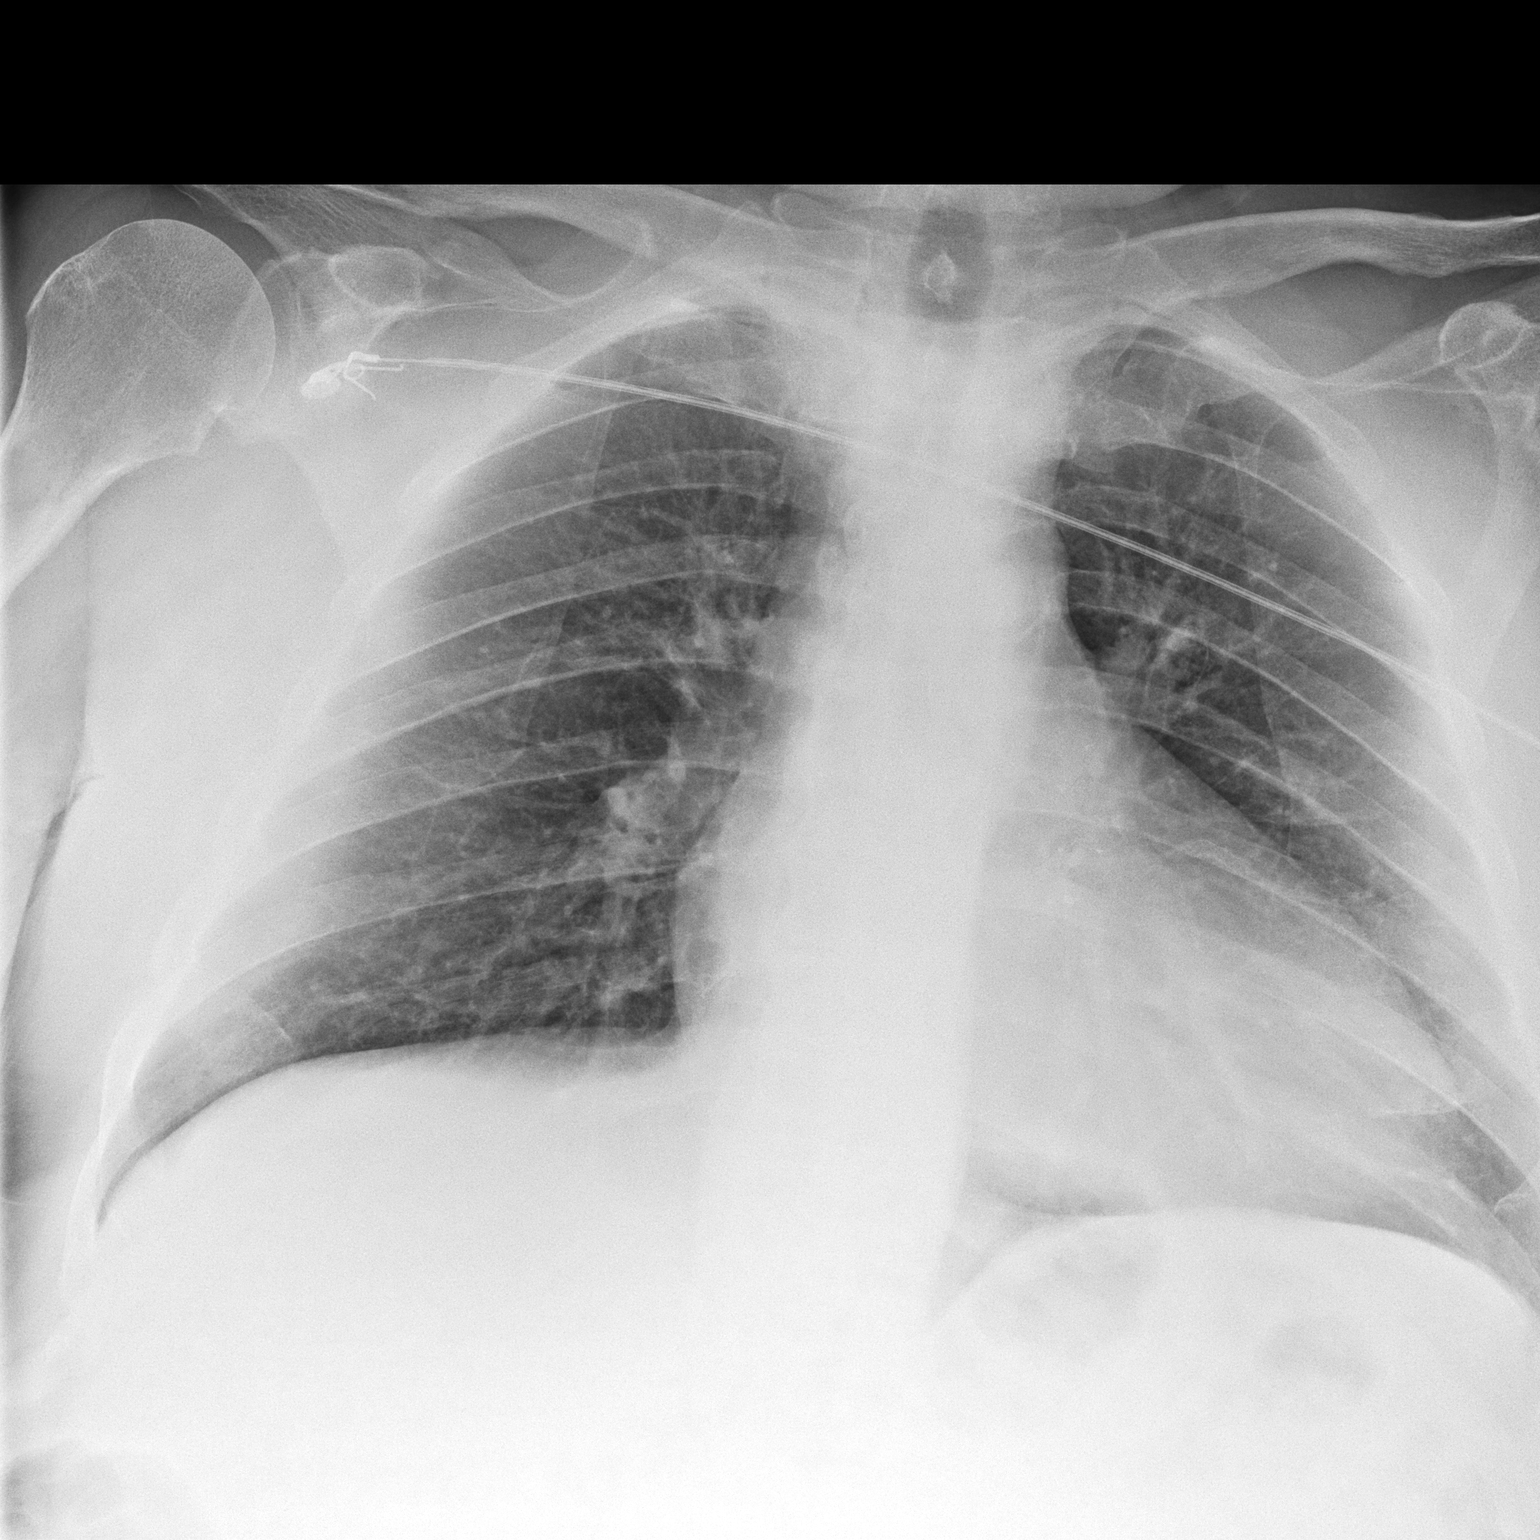
[im 2/2]
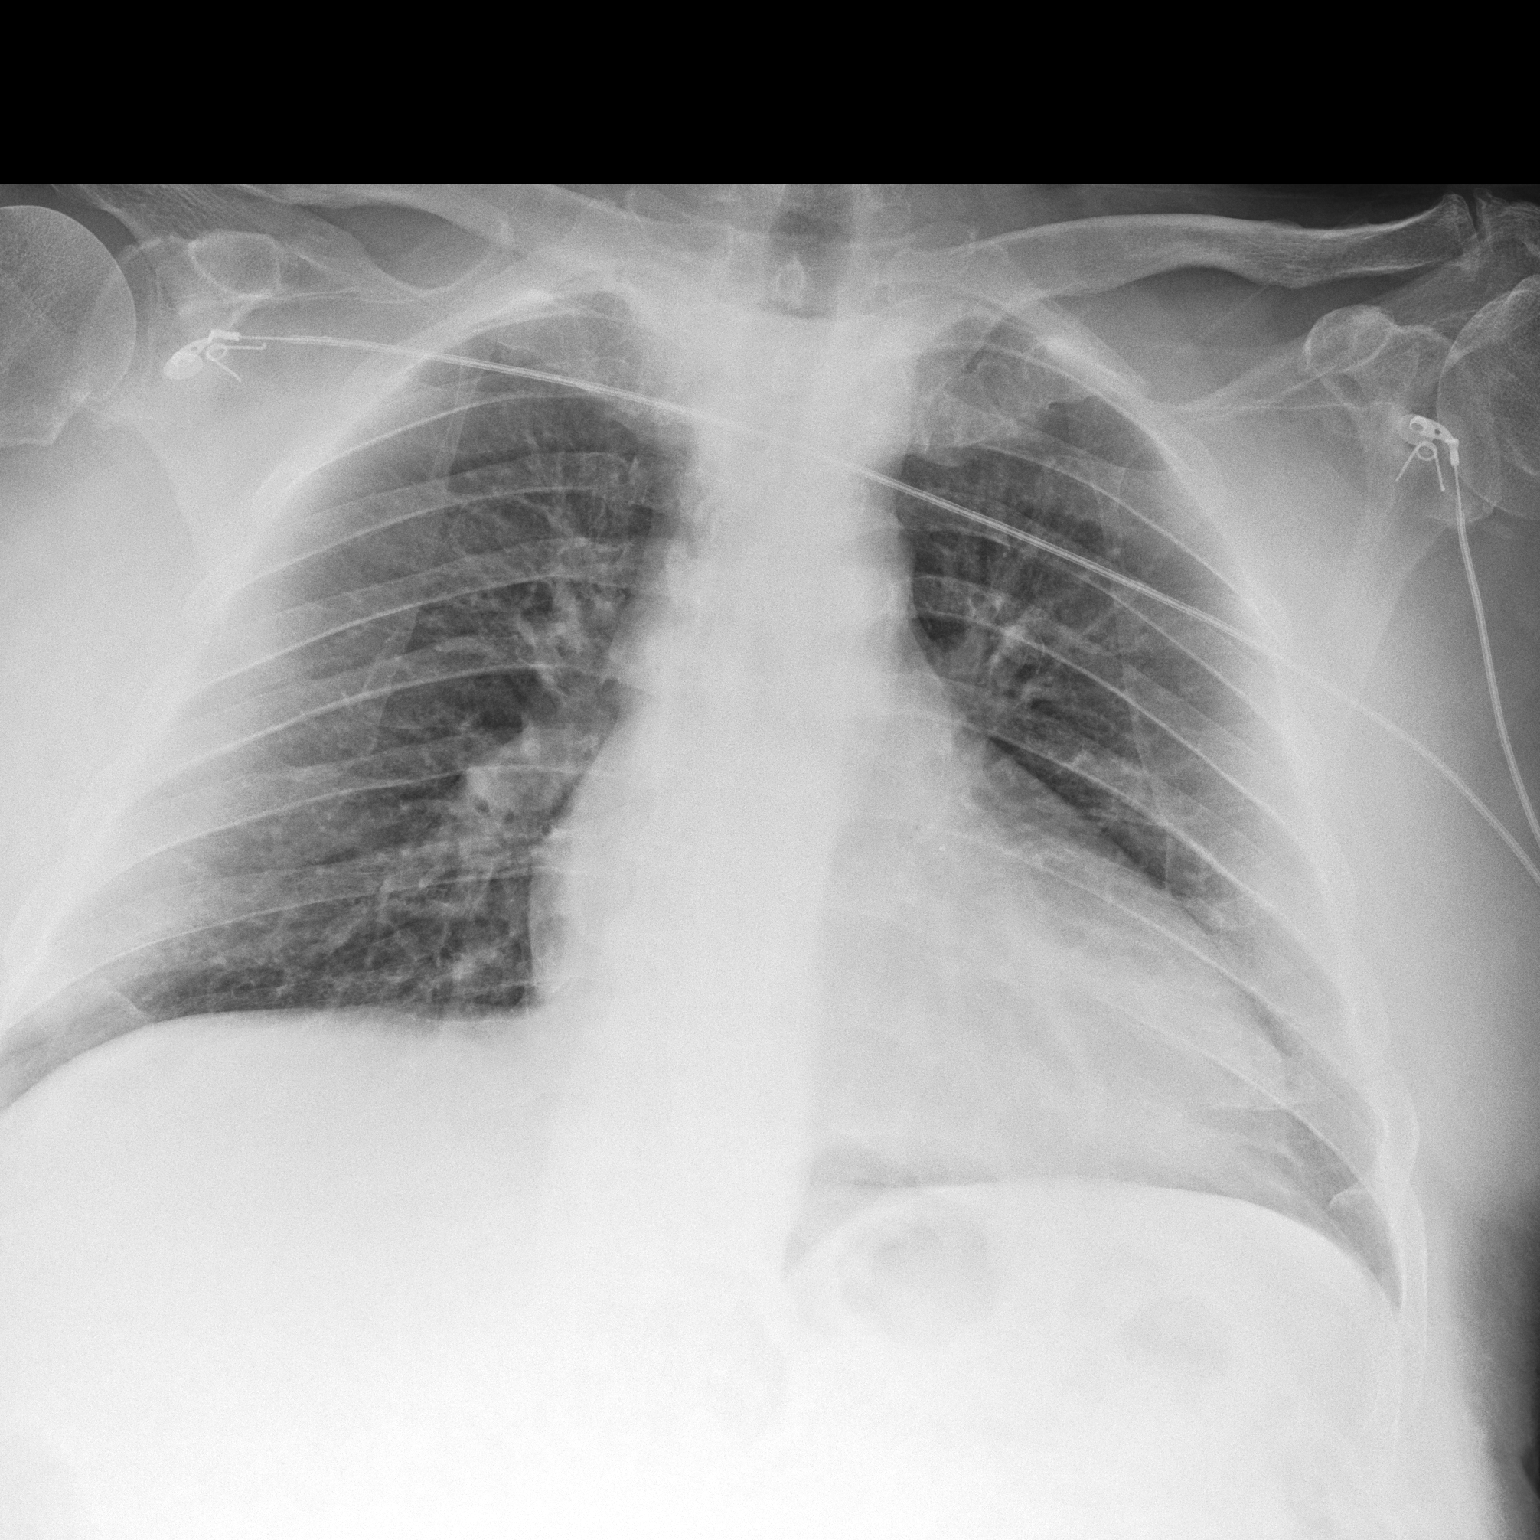

[2 of 2 positions shown; findings below may reference images not displayed]

FINDINGS: The cardiac silhouette is within normal limits. The mediastinum is in the midline. No consolidation or pulmonary edema. The costophrenic angles are sharp. No pneumothorax. The bones and soft tissues are unremarkable.
IMPRESSION: 
IMPRESSION: No radiographic evidence of an acute cardiopulmonary process.

## 2022-05-10 IMAGING — MR MRI PELVIS WITHOUT CONTRAST
4 of 6 series · 29 of 48 positions shown · non-contrast
Comparison: 12/18/2021

FINAL REPORT:
MRI pelvis:
CLINICAL INDICATION: Prostate cancer
TECHNIQUE: Multiplanar multisequence acquisition was performed without IV contrast.

[Series 1: survey · axial · 8.0mm · 0.88mm/px · z∈[-48,+225]mm · 6 of 19 slices shown]
[im 1/19]
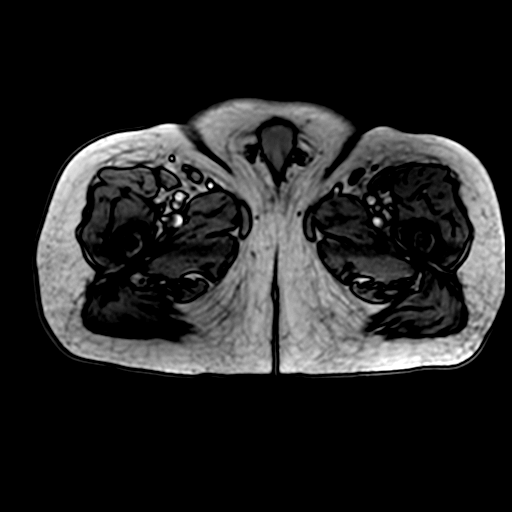
[im 4/19]
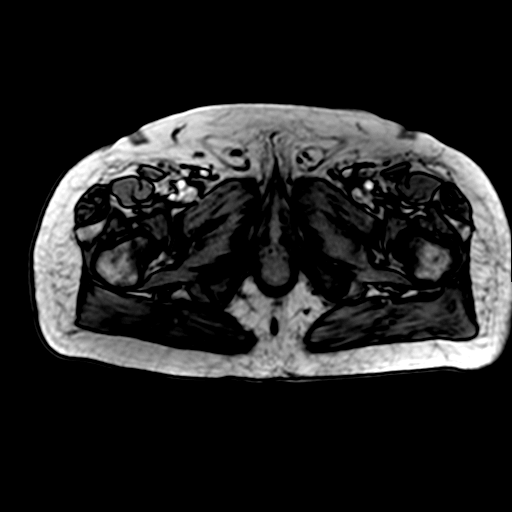
[im 8/19]
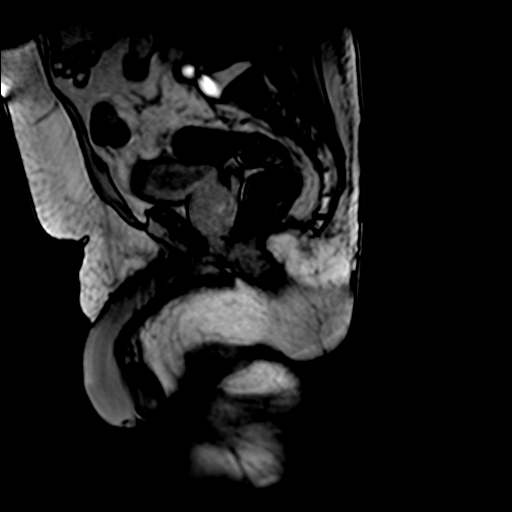
[im 11/19]
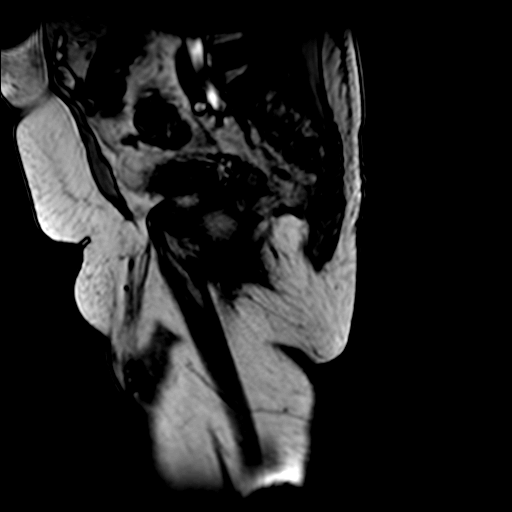
[im 15/19]
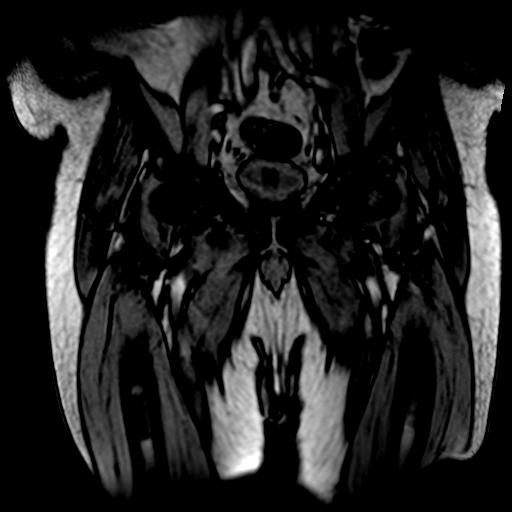
[im 19/19]
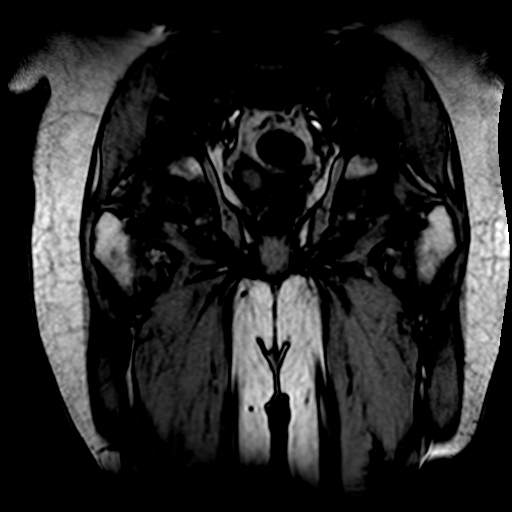

[Series 3: T2 · sagittal · 3.5mm · 0.62mm/px · 9 of 30 slices shown (1 of 2)]
[im 1/30]
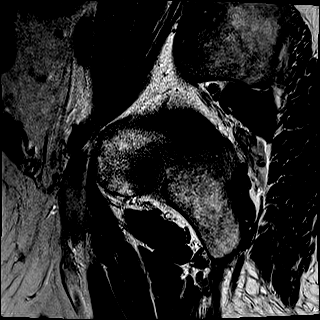
[im 4/30]
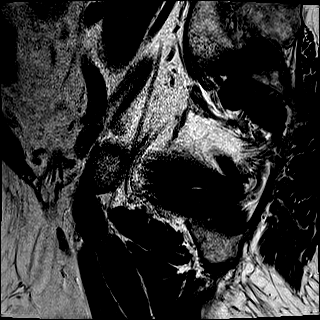
[im 8/30]
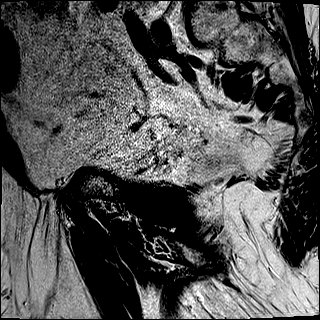
[im 11/30]
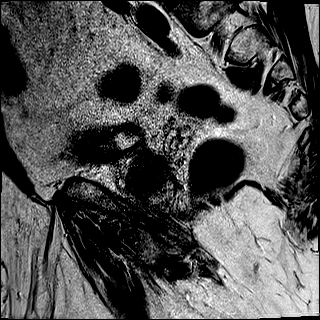
[im 15/30]
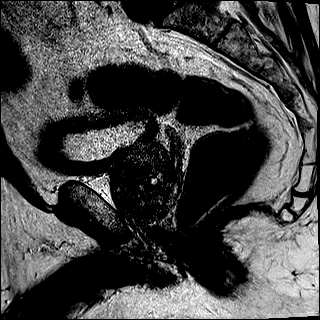
[im 19/30]
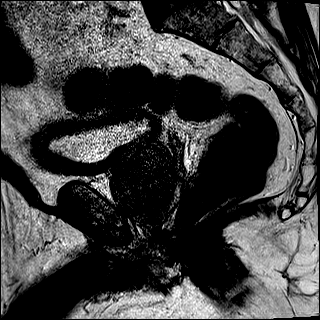
[im 22/30]
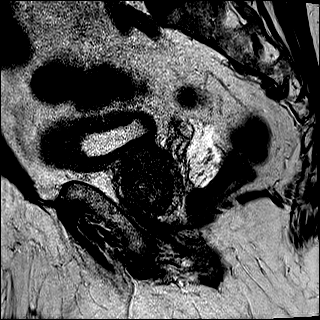
[im 26/30]
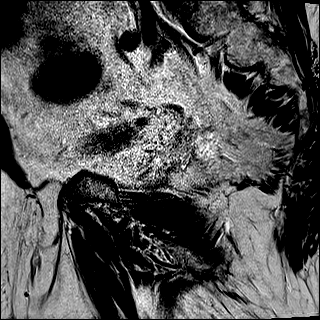
[im 30/30]
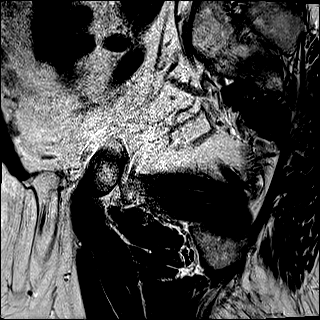

[Series 4: T2 · axial · 3.0mm · 0.62mm/px · z∈[-7,+86]mm · 8 of 32 slices shown (2 of 2)]
[im 1/32]
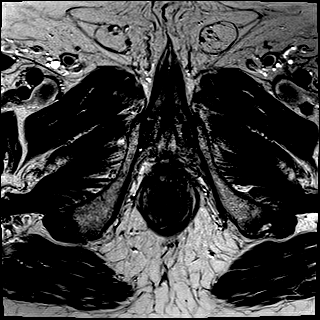
[im 4/32]
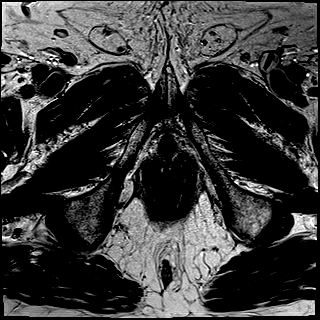
[im 11/32]
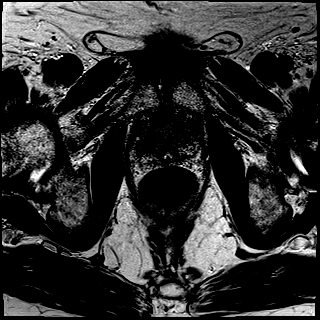
[im 14/32]
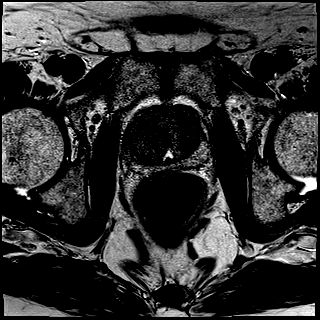
[im 18/32]
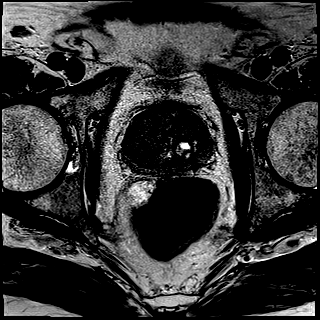
[im 21/32]
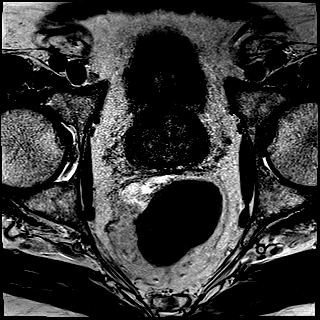
[im 28/32]
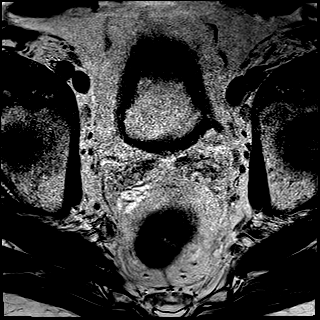
[im 32/32]
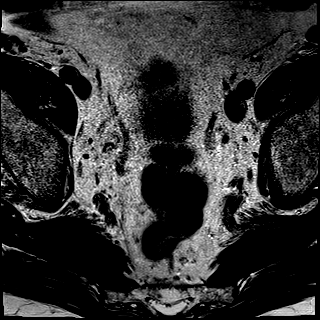

[Series 5: t2_me2(person_name)_(person_name)_(person_name)2 · axial · 3.0mm · 0.39mm/px · z∈[-7,+74]mm · 6 of 32 slices shown]
[im 1/32]
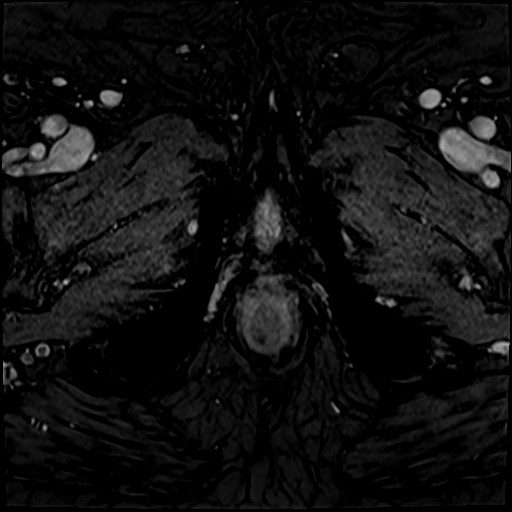
[im 4/32]
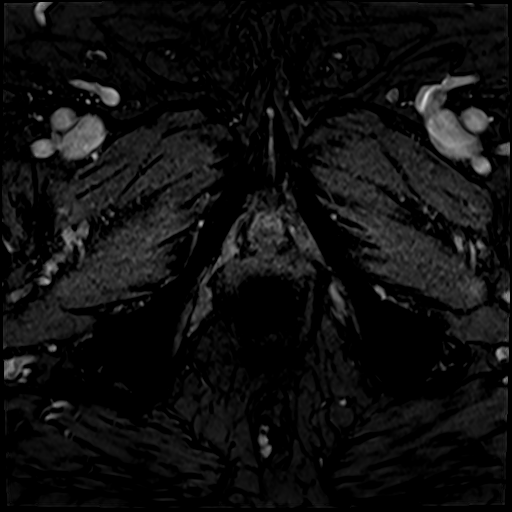
[im 11/32]
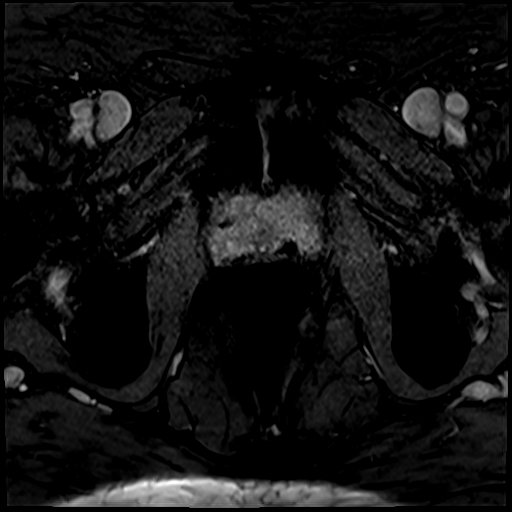
[im 14/32]
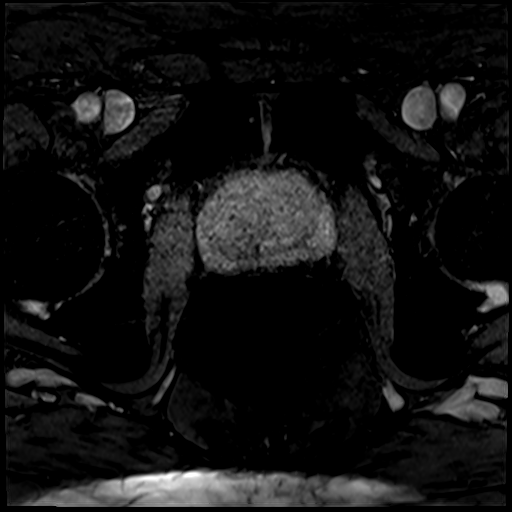
[im 18/32]
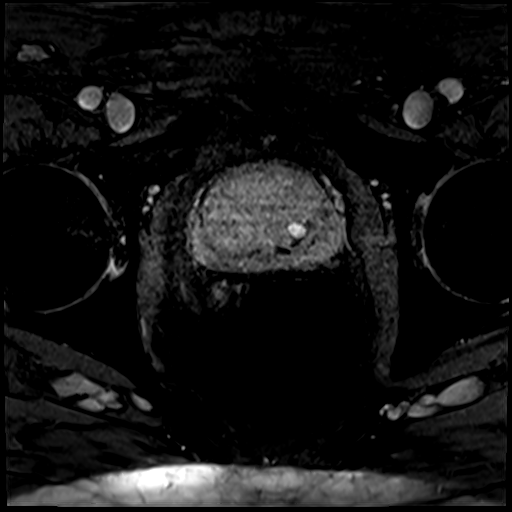
[im 28/32]
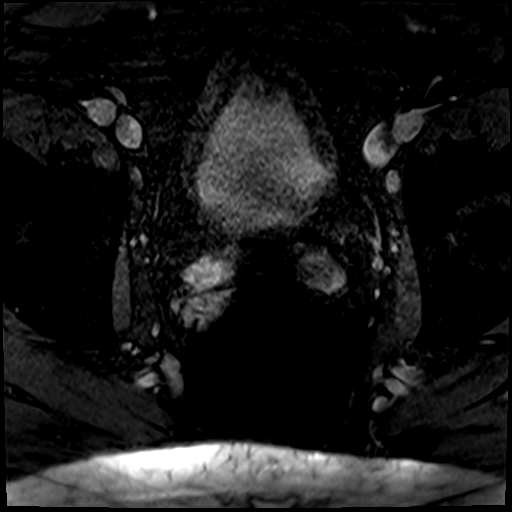

[29 of 48 positions shown; findings below may reference images not displayed]

FINDINGS: T2 hypointensity in the left peripheral zone is not significant change from previous examination. Lenticular focus of T2 hypointensity in the right transitional zone is unchanged. No new or enlarging prostate abnormality is appreciated. The prostate capsule well-defined. The seminal vesicles appear unremarkable. No bladder or bowel wall thickening is appreciated. No marrow signal abnormality is seen.
There is T2 hyperintensity between the rectum and right seminal vesicle consistent with SpaceOAR placement. This is above the level of the right transitional zone nodule. The lower rectal wall remains in close approximation to the lower prostate capsule.
IMPRESSION: SpaceOAR placement at the mid and upper prostate level on the right. The lower prostate and rectum remain in close approximation.

## 2022-10-18 IMAGING — CT CT ANGIOGRAM ABDOMEN PELVIS WITH CONTRAST
2 of 7 series · 12 of 46 positions shown, 18 images · non-contrast
Comparison: None

Started bleeding rectally at 3pm this afternoon
No surgeries on abdomen
HX of prostate cancer recently and hodgkins lymphoma with radiation about 5 years ago
Addendum:
(#[id number]
\F\[HOSPITAL]\F\
Communicated to: Dr. Slovensky
On behalf of: Dr. Ferienhaus Erxleben
By: Vy Rampersad
At: [DATE]
On: 10/18/2022 EST
\F\/[HOSPITAL]\F\#)
FINAL REPORT:
CLINICAL HISTORY: brisk gi bleed.
TECHNIQUE: axial post IV contrasted images obtained from the lung bases through the pelvis with multiplanar sagittal and coronal reconstructed images as well as 3-D and MIP reformatted images for better vessel characterization.  These were performed on separate reconstruction workstation.  CT dose reduction techniques were employed. Dose reduction measures were considered and/or implemented.

[Series 4: abd/pel w/ · axial · 0.98mm/px · z∈[-478,-88]mm · 9 of 196 slices shown, 15 images]
[im 20/196  soft-tissue]
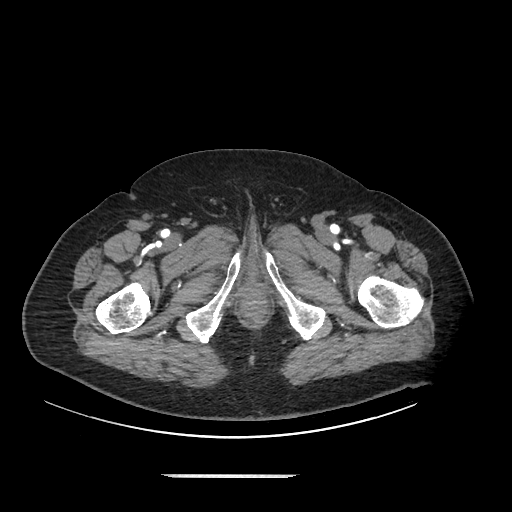
[im 20/196  bone]
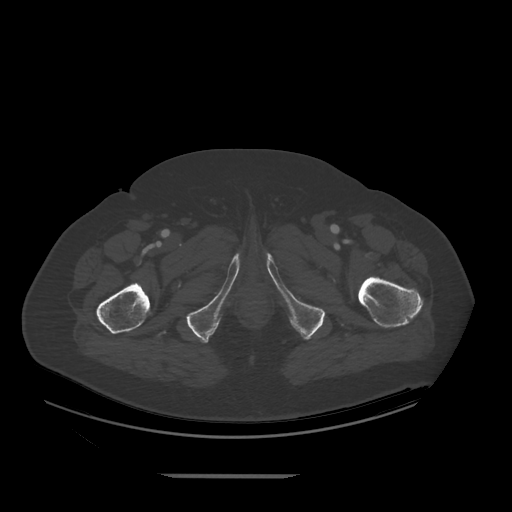
[im 40/196  soft-tissue]
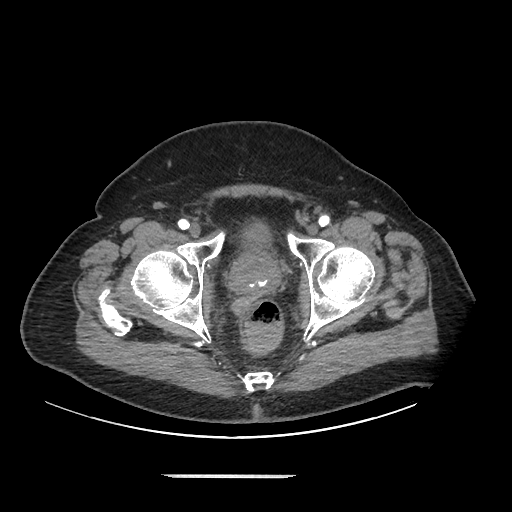
[im 59/196  soft-tissue]
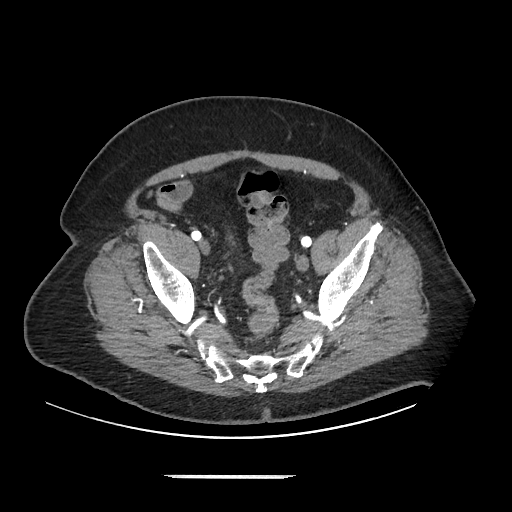
[im 79/196  soft-tissue]
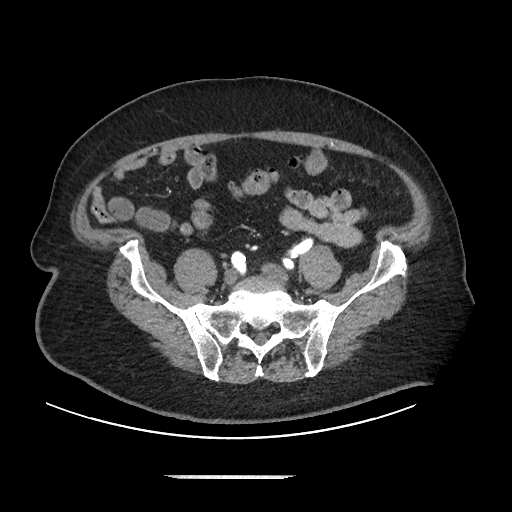
[im 98/196  soft-tissue]
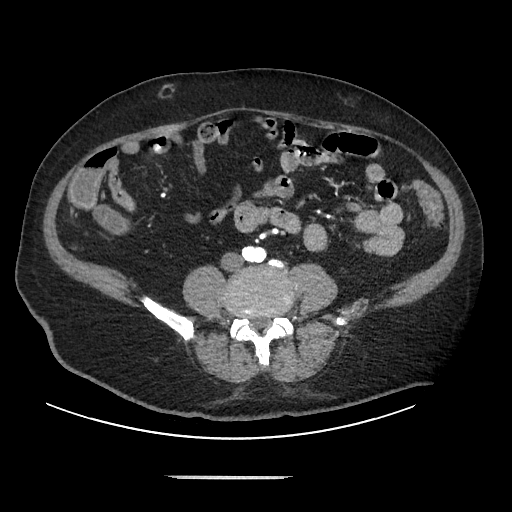
[im 118/196  soft-tissue]
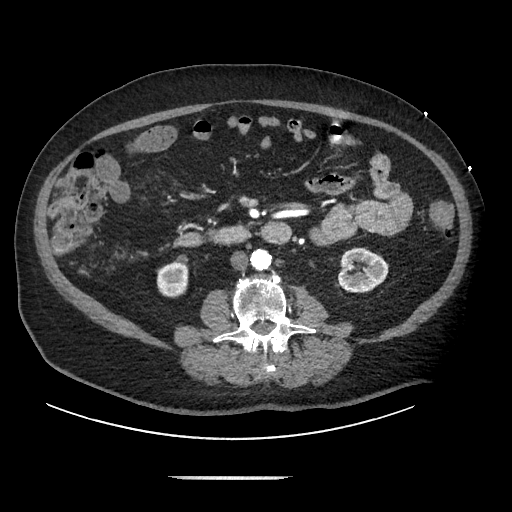
[im 118/196  lung]
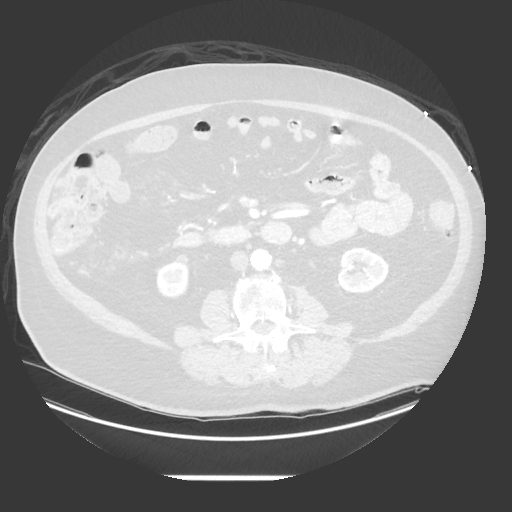
[im 137/196  soft-tissue]
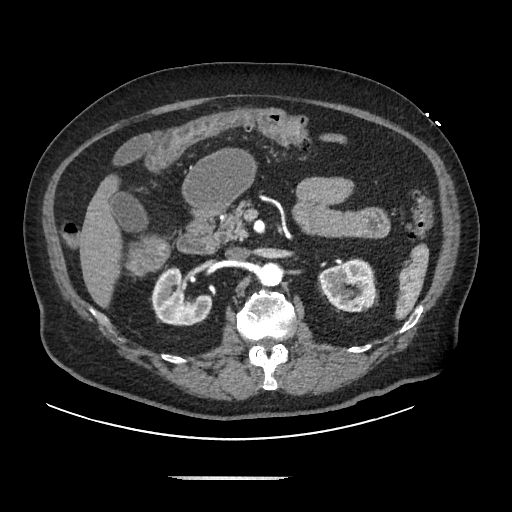
[im 137/196  lung]
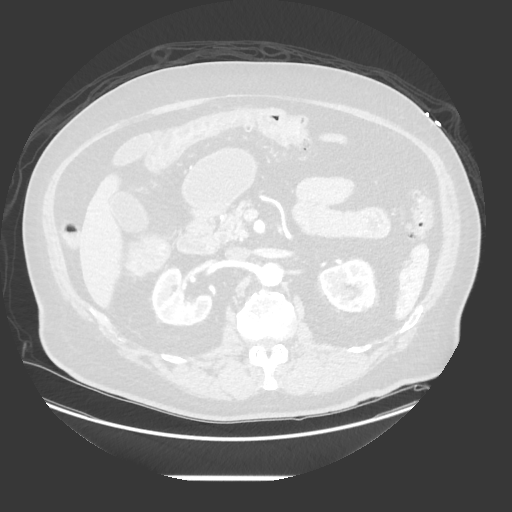
[im 157/196  soft-tissue]
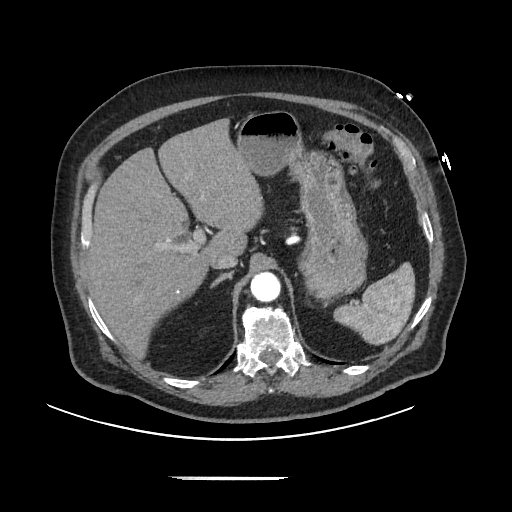
[im 157/196  lung]
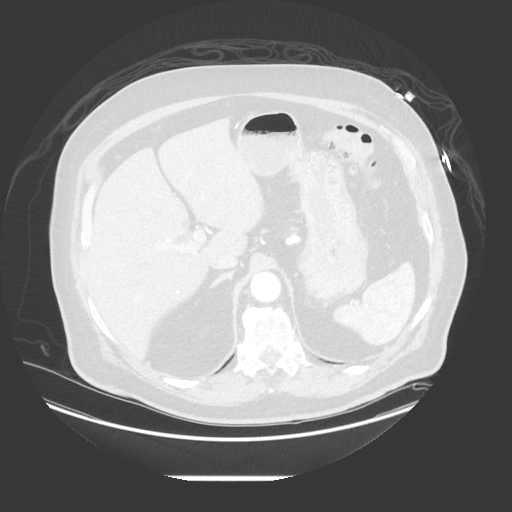
[im 176/196  soft-tissue]
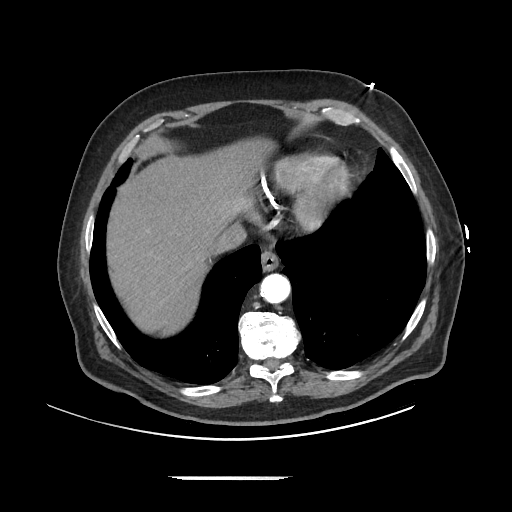
[im 176/196  lung]
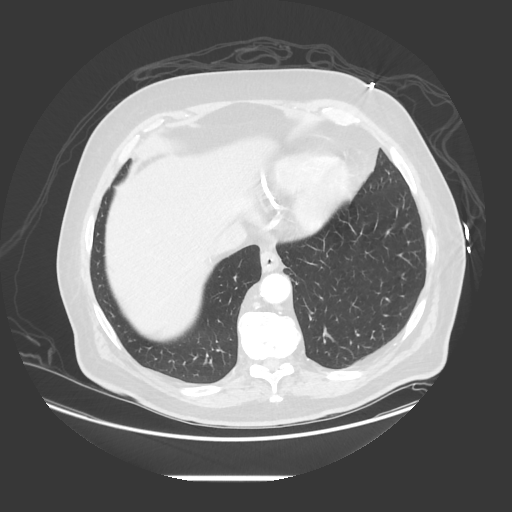
[im 176/196  bone]
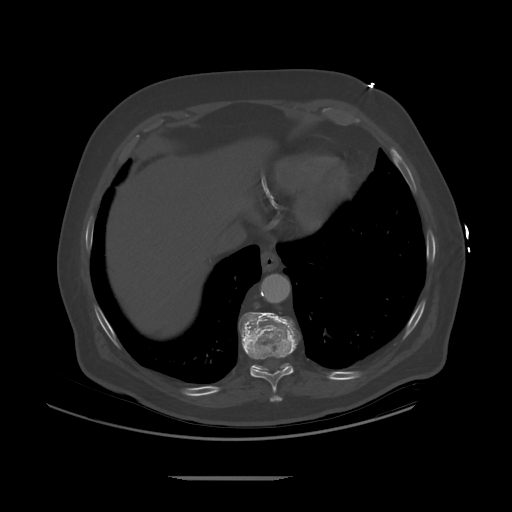

[Series 602: sag standard 2x2 · sagittal · 0.98mm/px · 3 of 236 slices shown]
[im 59/236  soft-tissue]
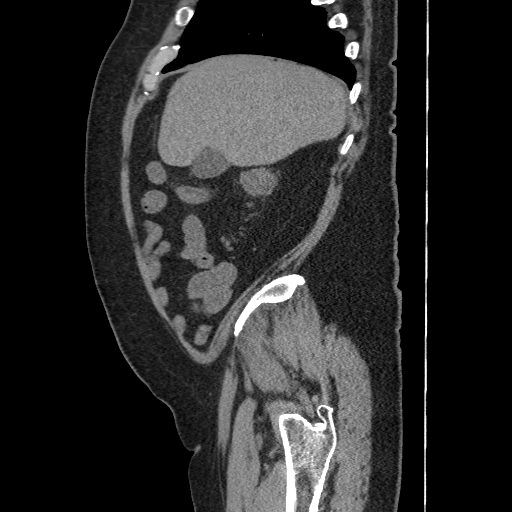
[im 118/236  soft-tissue]
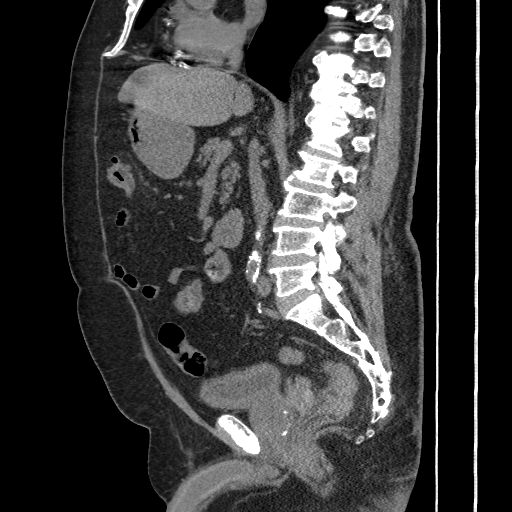
[im 177/236  soft-tissue]
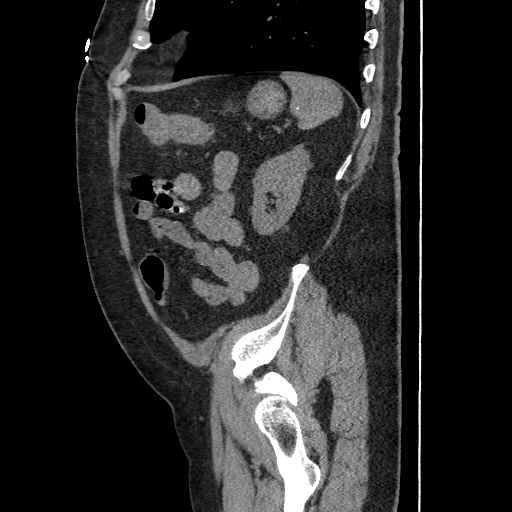

[12 of 46 positions shown; findings below may reference images not displayed]

FINDINGS: VASCULAR:
Aorta:  No significant stenosis, dissection, or aneurysm.
VISCERAL VESSELS:celiac, SMA, IMA and renal vessels demonstrate conventional anatomy and are widely patent.
Both common, internal and external iliac arteries are patent.
CT Abdomen:
Lung bases:  Unremarkable
Liver: The liver enhances homogeneously with no evidence of mass or intrahepatic biliary ductal dilatation.
Gallbladder:   Normal
Pancreas: The pancreas is normal in appearance.
Spleen: The spleen is normal in size and appearance..
Adrenal: The adrenal glands are normal in appearance.
Kidneys: The kidneys enhance normally and symmetrically without masses or hydronephrosis. There are bilateral renal cysts the largest measures 2.3 cm medial left kidney.
Bowel: Small bowel is normal. There is diffuse thickening of the transverse colon. There is scattered diverticulosis throughout the colon. Contrast is seen within the proximal transverse colon extending several centimeters which is stable in appearance on delayed imaging.
CT pelvis:
Bladder: Bladder is diffusely thick walled with adjacent fat stranding.
Reproductive Organs: Enlarged with coarse calcifications. There is a right asymmetrically enlarged seminal vesicle.
Lymph nodes: No significant mesenteric, retroperitoneal, pelvic or inguinal lymphadenopathy is appreciated
Abdominal Wall: There is a fat-containing left greater than right inguinal hernia..
Bony Structures: No aggressive bone lesions.
IMPRESSION: :
1.  There is contrast extravasation within the proximal transverse colon consistent with active GI bleed. This is accompanied by diffuse thickening of the transverse colon likely infectious.
2.  Findings of cystitis this could be infectious or inflammatory or related to outlet obstruction.. Correlate with urinalysis.
3.  Prostatomegaly with asymmetrically enlarged right seminal vesicle. BPH versus prostate cancer correlate with PSA and digital rectal exam.
4.  Diverticulosis.

## 2022-10-27 IMAGING — CT CT ABDOMEN PELVIS WITH CONTRAST
2 of 3 series · 15 of 46 positions shown, 17 images · IV contrast (agent unspecified)
Comparison: CT 10/18/2022.

Abdominal pain and nausea
FINAL REPORT:
CT Abdomen and Pelvis  with contrast
CLINICAL DATA: Abdominal pain, acute, nonlocalized. Abdominal pain and nausea

[Series 2: abdomen/pel with · axial · 0.96mm/px · z∈[-439,+16]mm · 12 of 210 slices shown, 14 images]
[im 14/210  soft-tissue]
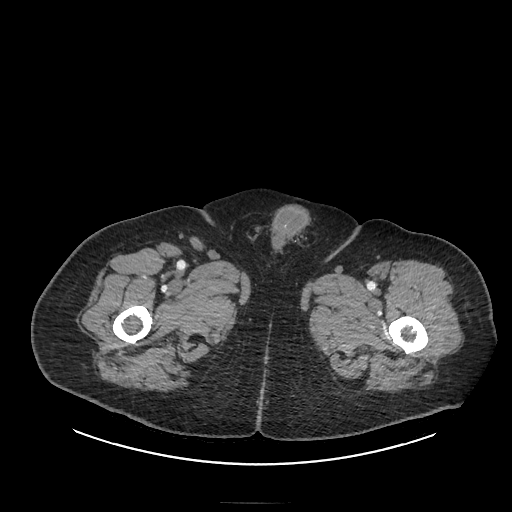
[im 14/210  bone]
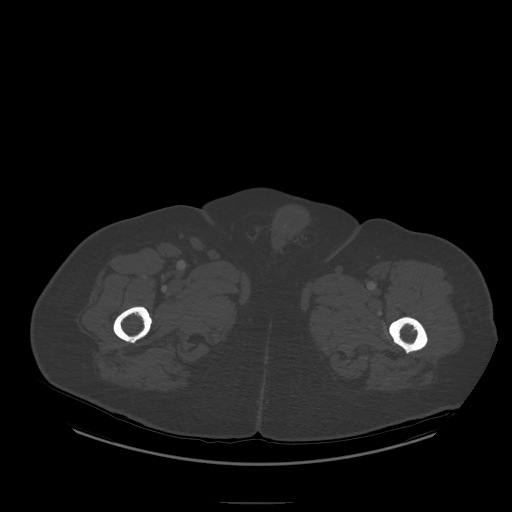
[im 27/210  soft-tissue]
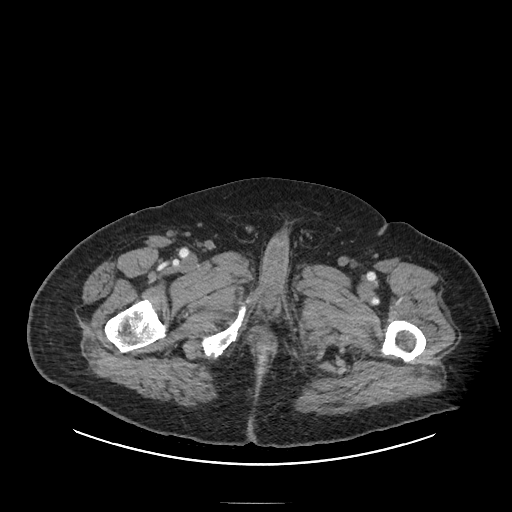
[im 48/210  soft-tissue]
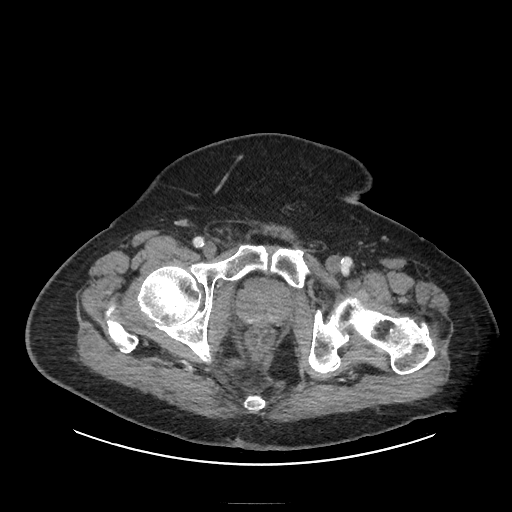
[im 61/210  soft-tissue]
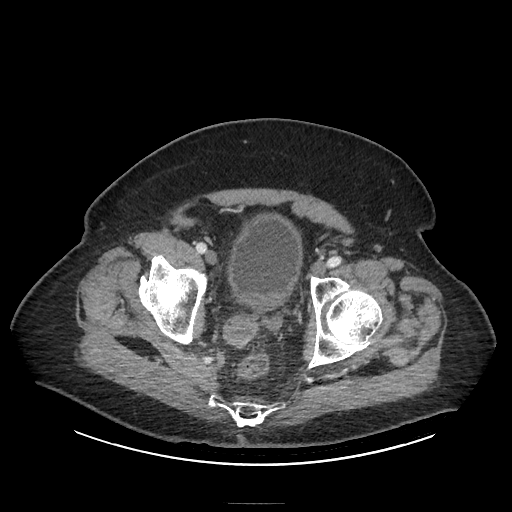
[im 81/210  soft-tissue]
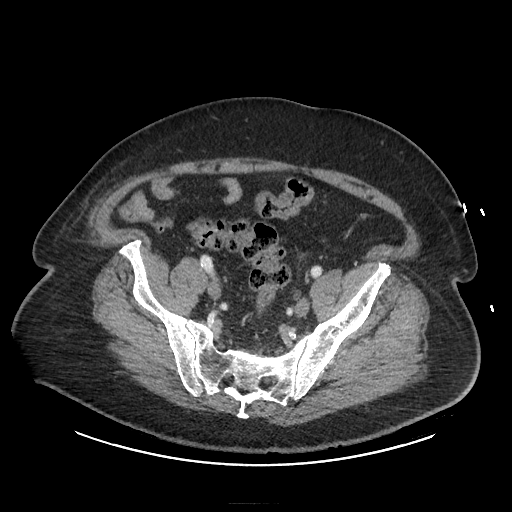
[im 95/210  soft-tissue]
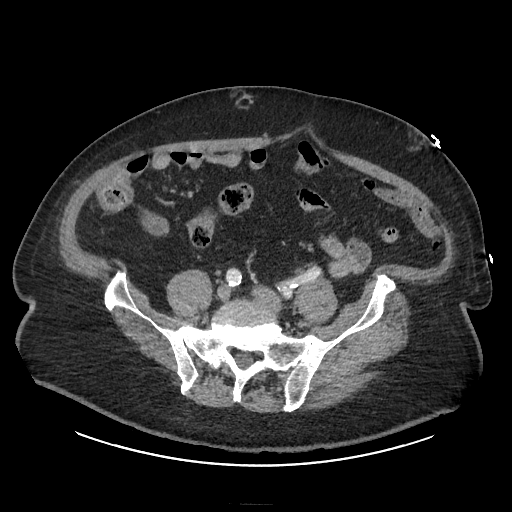
[im 115/210  soft-tissue]
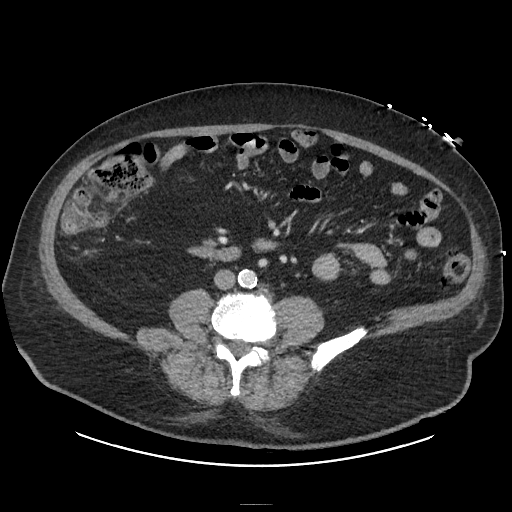
[im 129/210  soft-tissue]
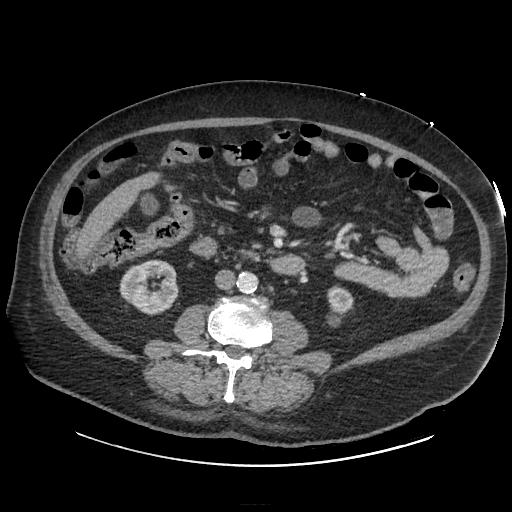
[im 149/210  soft-tissue]
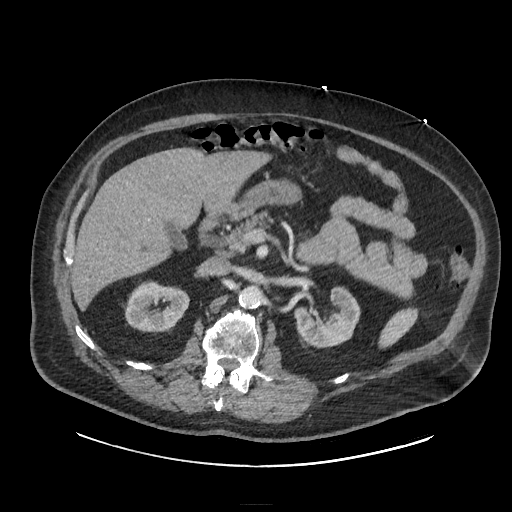
[im 149/210  bone]
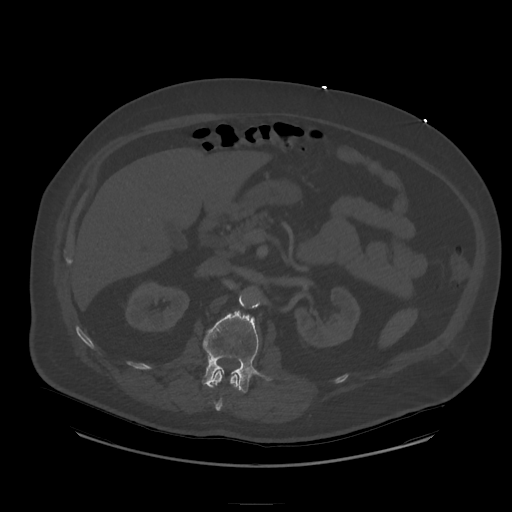
[im 162/210  soft-tissue]
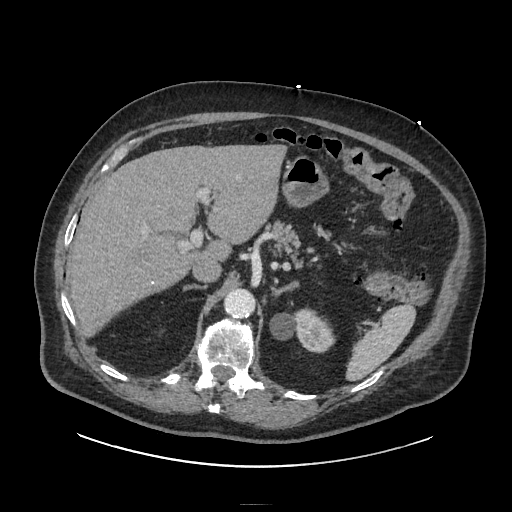
[im 183/210  soft-tissue]
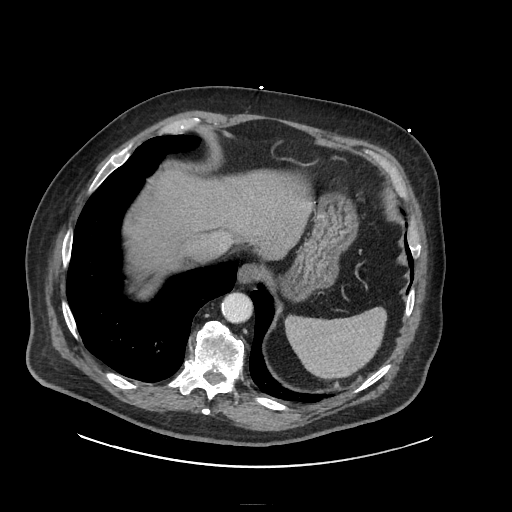
[im 196/210  soft-tissue]
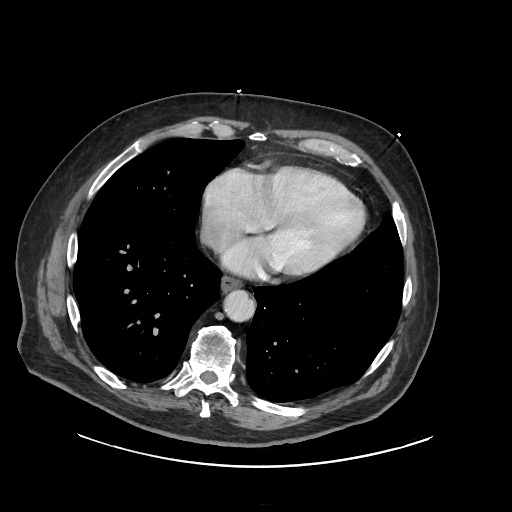

[Series 602: sag standard 2x2 · sagittal · 1.02mm/px · 3 of 245 slices shown]
[im 82/245  soft-tissue]
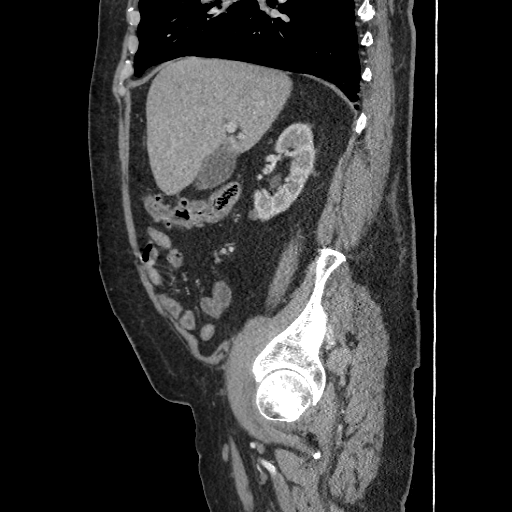
[im 109/245  soft-tissue]
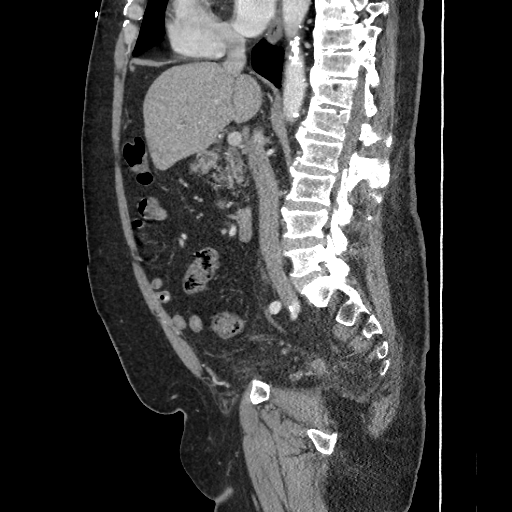
[im 136/245  soft-tissue]
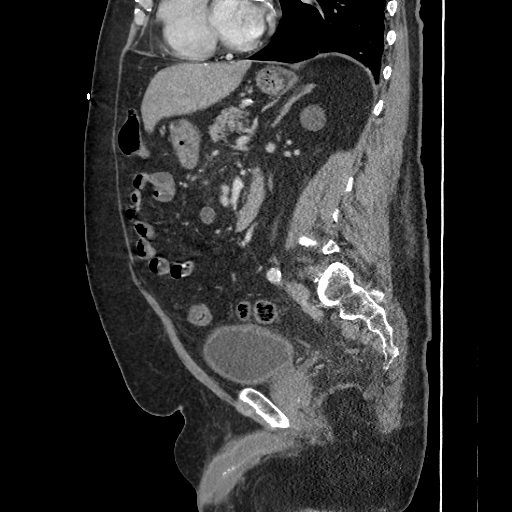

[15 of 46 positions shown; findings below may reference images not displayed]

Previously provided history of small cell B-cell lymphoma.
Procedure: Standard dose IV contrast for routine protocol
All CT scans at this facility use iterative reconstruction, dose modulation and/or weight based dosing when appropriate to reduce radiation dose to as low as reasonably achievable.
Findings Abdomen:
Calcified nodes along the right lower lobe bronchovascular bundle, healed granulomatous disease.
No basilar infiltrate or effusion.
Bilateral renal cysts of varying size. Nephrograms otherwise symmetric. No hydroureteronephrosis. No adrenal nodule.
Calcified hepatic granulomas. Liver otherwise unremarkable.
Calcified splenic granulomas. Spleen otherwise unremarkable. Excessively lobule.
Mild fatty atrophy of pancreas.
Calcified aortic plaque. No ectasia.
Unopacified bowel without transition zone in upper abdomen. Pancolonic diverticulosis.
Recent CT 10/18/2022 reported wall thickening proximal transverse colon. In the absence of luminal opacification, not appreciably persistent example axial image 83). Previous localized pericolonic inflammation has resolved in the right upper quadrant.
FINDINGS PELVIS: Unopacified pelvic bowel without transition zone.
Air refluxes in nondilated appendix.
Enlarged prostate with dystrophic calcification.
For the degree of distention, mild bladder wall thickening. Induration of fat planes around the urinary bladder. Pelvic ureters collapsed without calculus.
Asymmetric enlargement of the right seminal vesicle with rim enhancement around a 2.4 x 1.7 cm central hypodensity, suspicious for abscess. Inflammatory change around the seminal vesicles.
Rectum collapsed. Mild induration of perirectal fat planes.
Lipomatosis of the lower rectum without appreciable mural edema.
Calcified iliofemoral atherosclerosis.
Bone windows accentuate spondylosis.
IMPRESSION: 
IMPRESSION: 1. Persistent enlargement of the right seminal vesicle with rim-enhancing 2.4 cm oval lesions suspicious for abscess.
2. Enlarged prostate.
In the setting of apparent infection, consider prostatitis. Differential for enlarged prostate also includes benign prostatic hypertrophy, prostate carcinoma. Correlate to posttreatment PSA.
3. With enlarged prostate, mild circumferential bladder wall thickening could reflect trabecular hypertrophy, but induration of fat planes around the urinary bladder is suspicious for cystitis.
4. Mild induration of the fat planes. Favor secondary inflammation, no direct signs of proctitis.
5. Pancolonic diverticulosis. CT 10/18/2022 reported focal wall thickening in the proximal transverse colon. In the absence of luminal opacification, no persistent wall thickening. Previous mild pericolonic inflammatory change has resolved.
Favor resolved diverticulitis. As a prudent precaution, recommend follow-up imaging or endoscopy, as colon cancer can mimic diverticular disease.
6. Other chronic findings as listed
Comment:
Unless the patient's specific circumstances suggest otherwise, any liver lesion 0.5 cm or less, any cystic kidney lesion less than 1.0 cm, and/or any adrenal lesion 1.0 cm or less not otherwise characterized in this report as possessing suspicious or indeterminate imaging features is/are most likely to be benign and do not require follow-up imaging or biopsy

## 2022-11-16 IMAGING — CR XR CHEST 1 VIEW
1 series · 2 of 2 positions shown · non-contrast
Comparison: 03/29/22

Chest pain
FINAL REPORT:
Chest portable one view
INDICATION: chest pain

[Series 4738: AP · 2 of 2 slices shown]
[im 1/2]
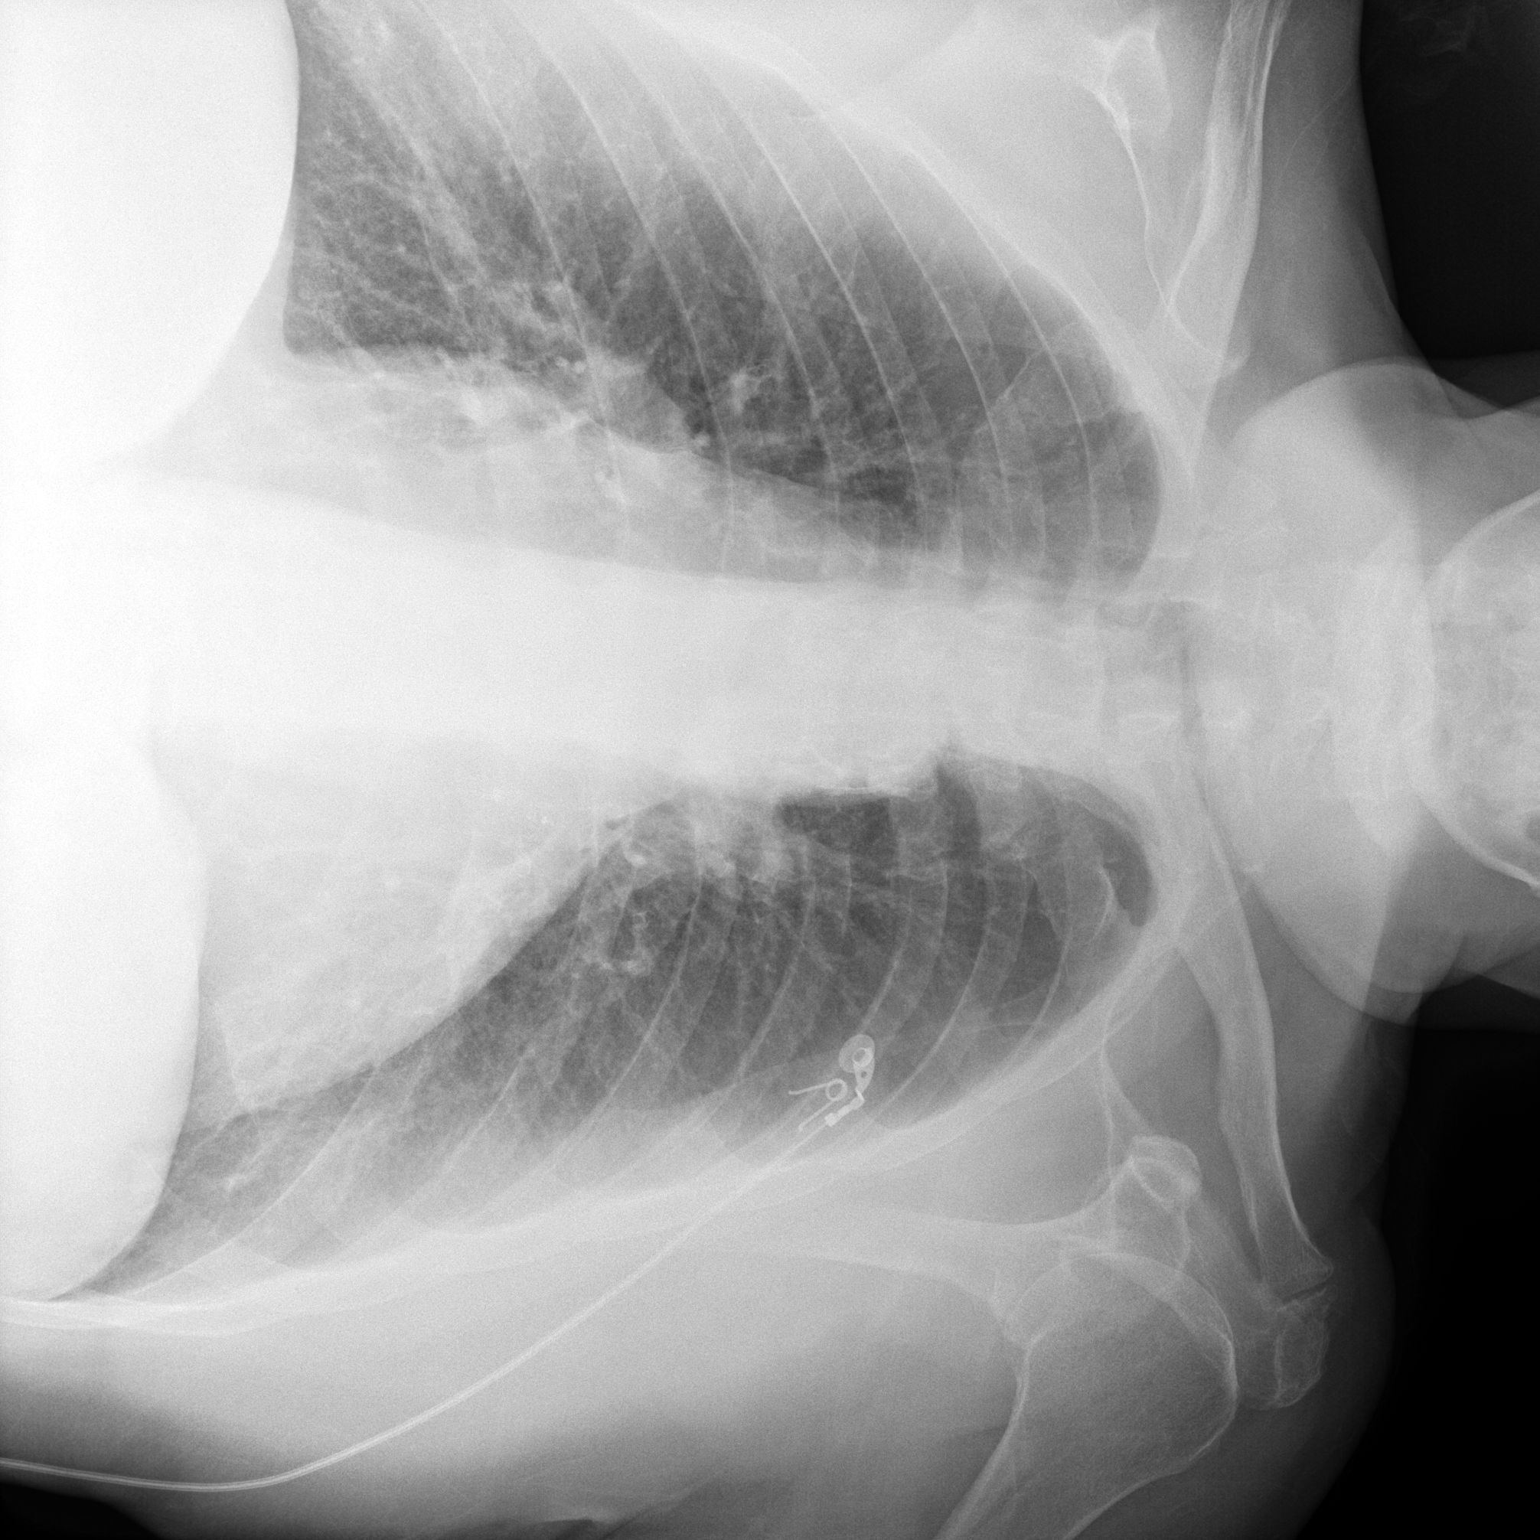
[im 2/2]
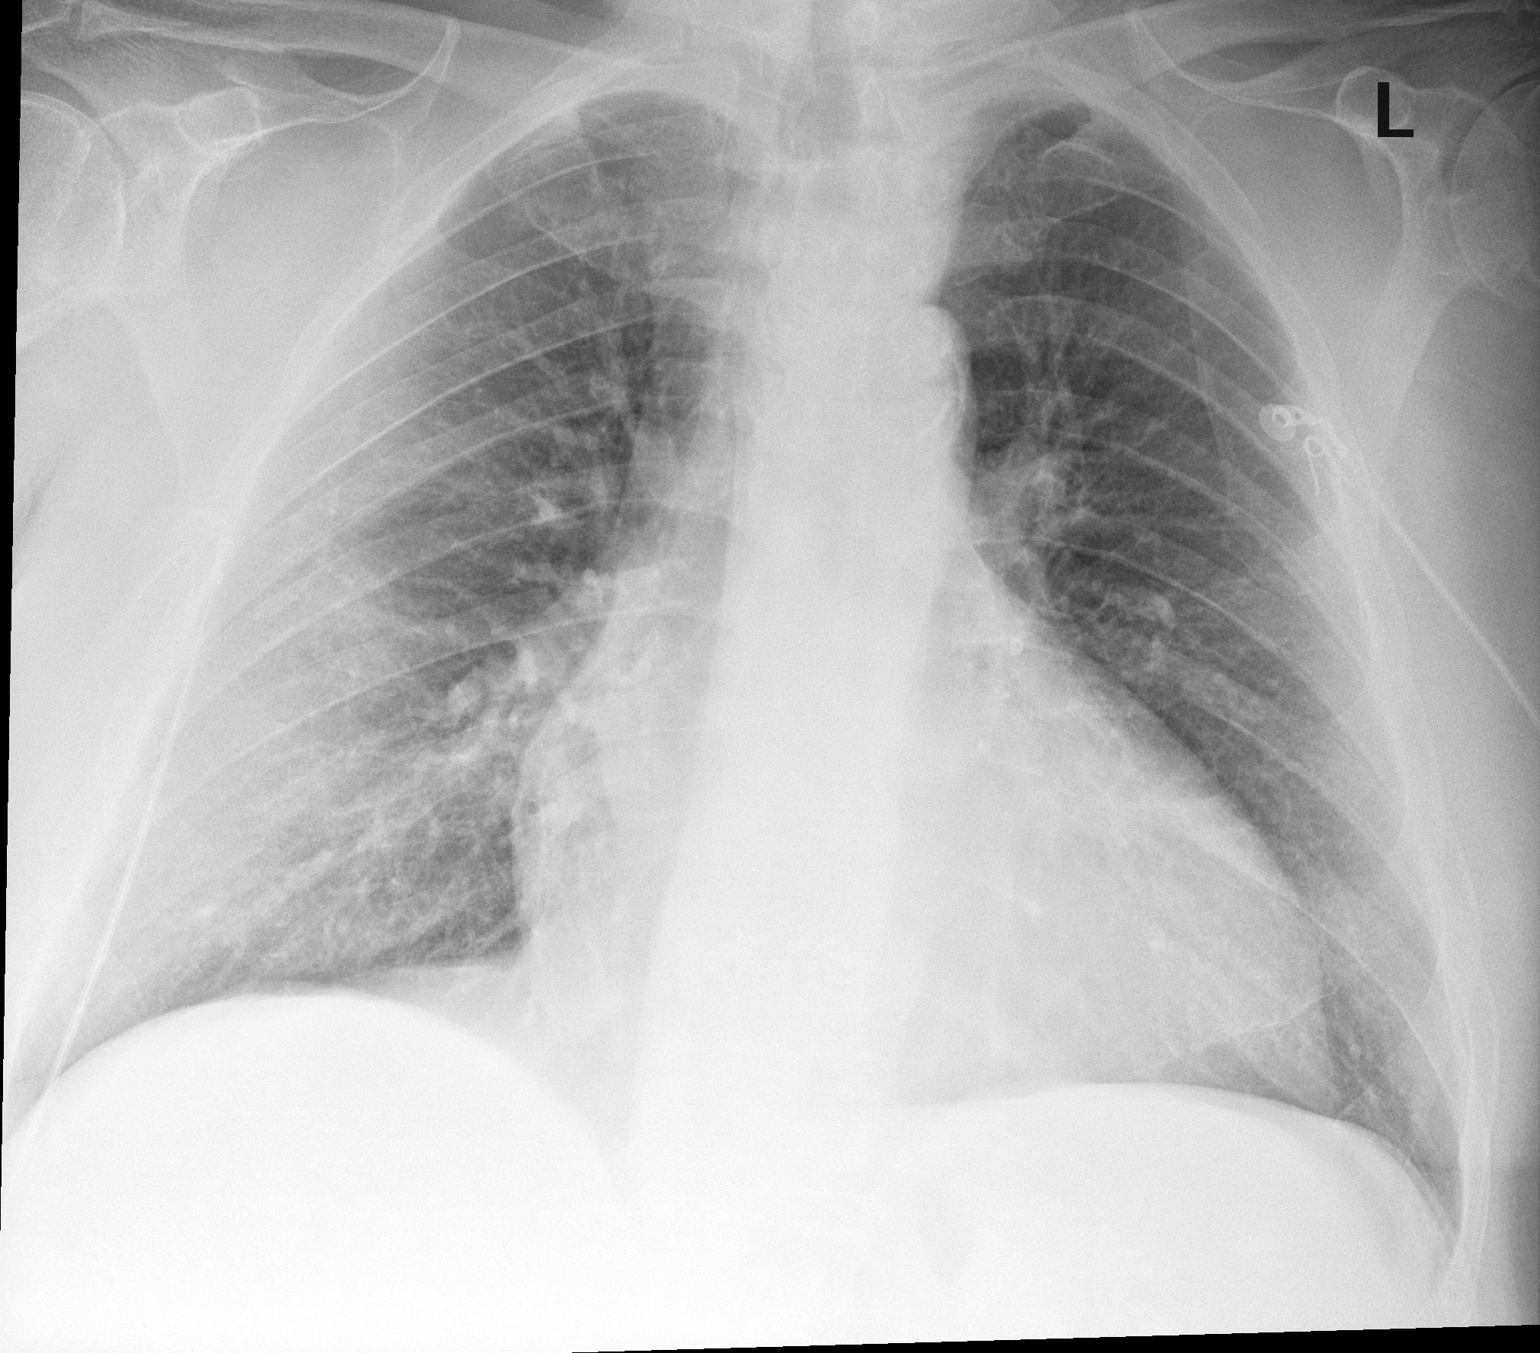

[2 of 2 positions shown; findings below may reference images not displayed]

FINDINGS: The cardiac silhouette is within normal limits.  The lungs are clear.   No pneumothorax. No pleural effusion.   No acute osseous abnormalities.
IMPRESSION: 
IMPRESSION: No acute cardiopulmonary process.
Portable?
Yes

## 2022-12-14 IMAGING — DX XR CHEST 1 VIEW
1 series · 1 of 1 positions shown · non-contrast
Comparison: 11/16/2022
There is no acute parenchymal consolidation.

SOB
FINAL REPORT:
Chest AP portable:
CLINICAL INDICATION: Shortness of breath

[AP]
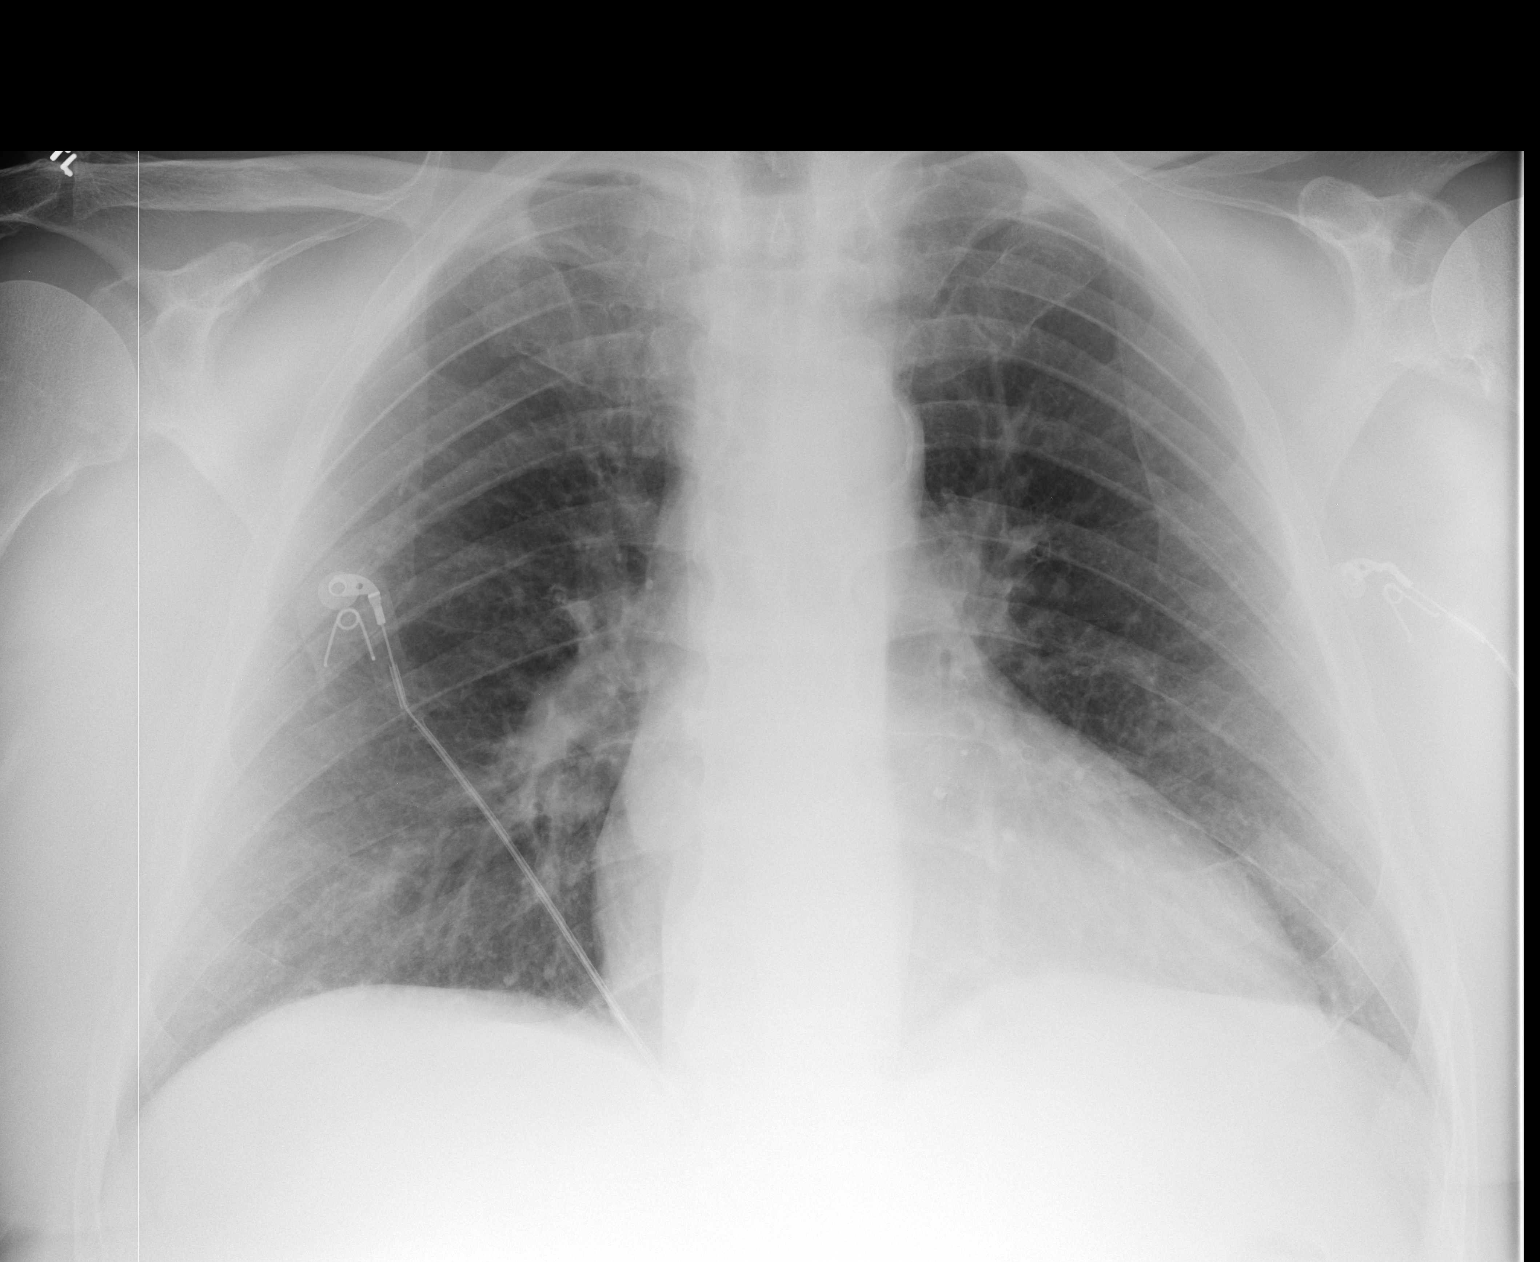

[1 of 1 positions shown; findings below may reference images not displayed]

No vascular congestion, pneumothorax or gross effusion is seen. Heart appears of normal size. The skeletal structures appear intact.
IMPRESSION: No acute intrathoracic process. No significant change.

## 2023-03-14 IMAGING — CR XR CHEST 1 VIEW
1 series · 1 of 1 positions shown · non-contrast
Comparison: 12/14/22

Shortness of breath
FINAL REPORT:
XR CHEST 1 VIEW
INDICATION / CLINICAL INFORMATION:
Difficulty breathing

[AP]
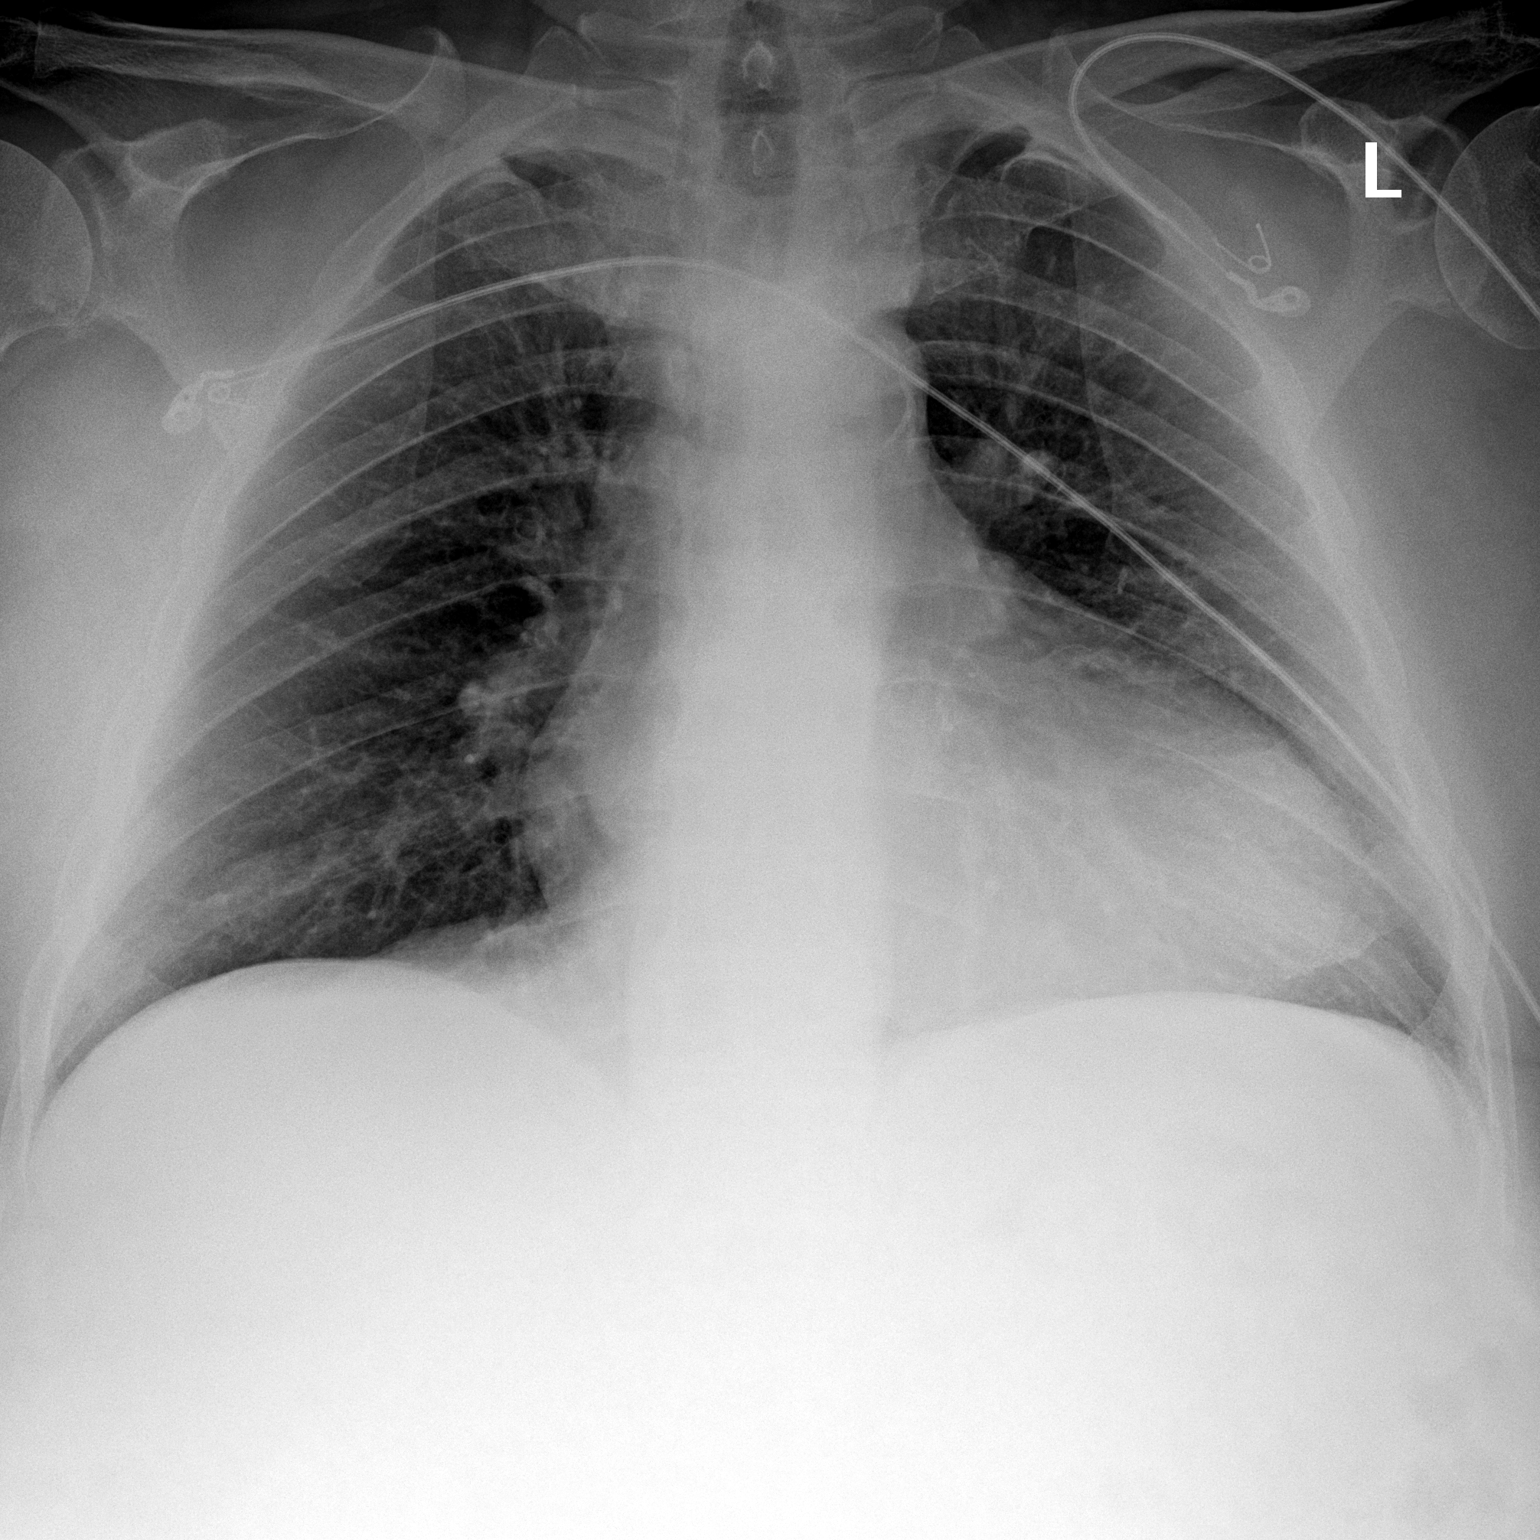

[1 of 1 positions shown; findings below may reference images not displayed]

FINDINGS: SUPPORT DEVICES: None.
HEART /PULMONARY VASCULATURE: Stable cardiomegaly. No overt failure.
LUNGS / PLEURA: No acute pulmonary or pleural abnormality. No pneumothorax.
IMPRESSION: Cardiomegaly without overt failure or other acute chest process.

## 2023-06-09 IMAGING — CT CT ABDOMEN PELVIS WITH CONTRAST
2 of 4 series · 17 of 46 positions shown, 19 images · IV contrast (agent unspecified)
Comparison: Abdomen pelvis CT from 10/27/2022

ABD PAIN N/V/D
NON HODGKINS LYMPHOMA RAD
FINAL REPORT:
Abdomen CT with contrast
Pelvis CT with contrast
HISTORY: llq and epigastric pain
TECHNIQUE: Images were obtained through the abdomen and pelvis after administration of IV contrast. Informed consent was obtained prior to administration IV contrast.

[Series 3: thins · axial · 0.88mm/px · z∈[-465,-1]mm · 14 of 808 slices shown, 16 images]
[im 33/808  soft-tissue]
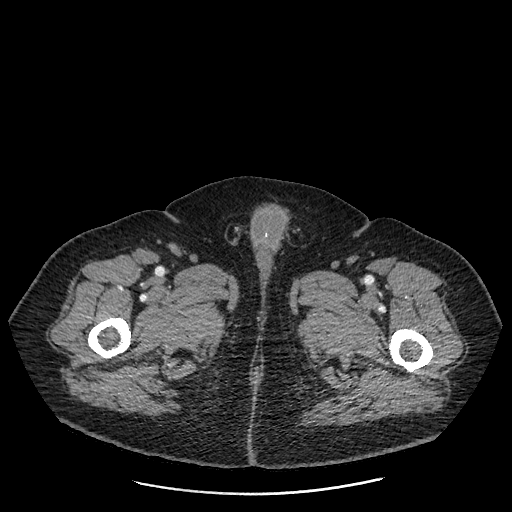
[im 33/808  bone]
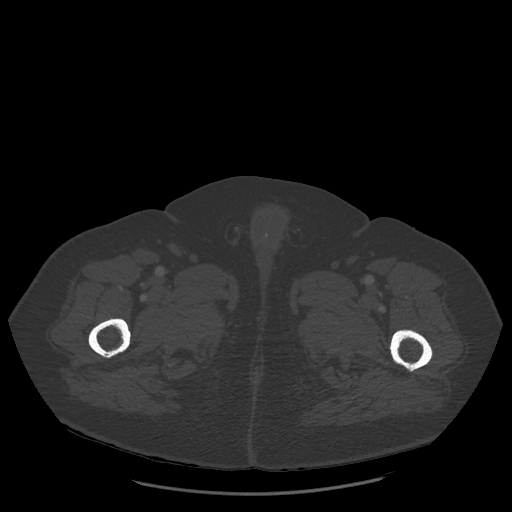
[im 97/808  soft-tissue]
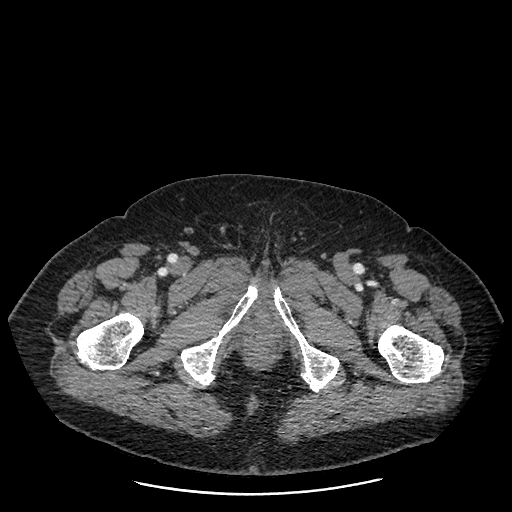
[im 162/808  soft-tissue]
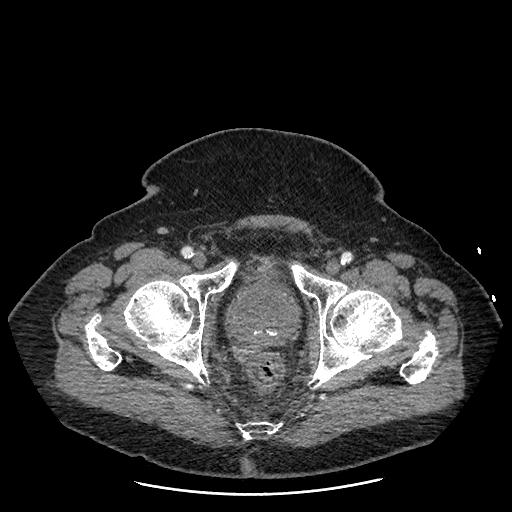
[im 226/808  soft-tissue]
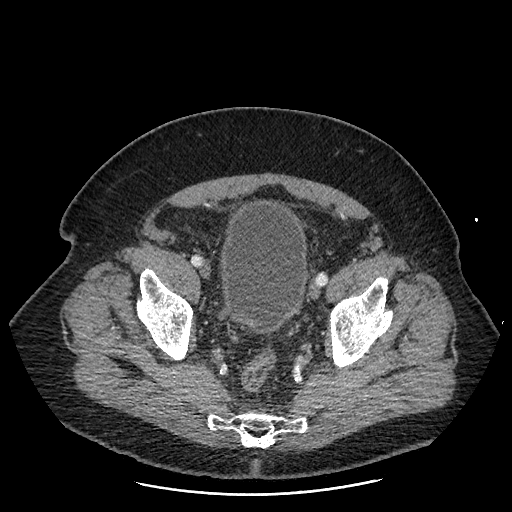
[im 259/808  soft-tissue]
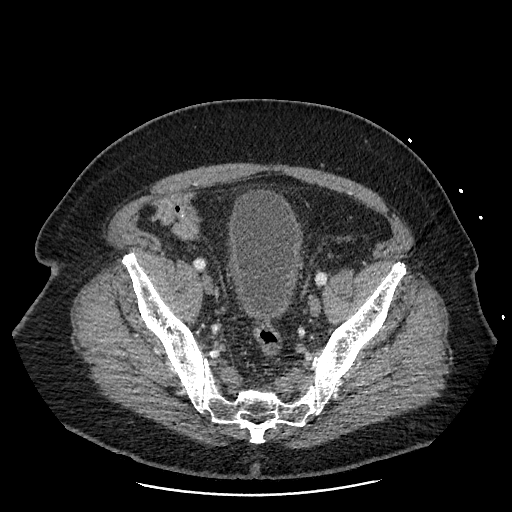
[im 323/808  soft-tissue]
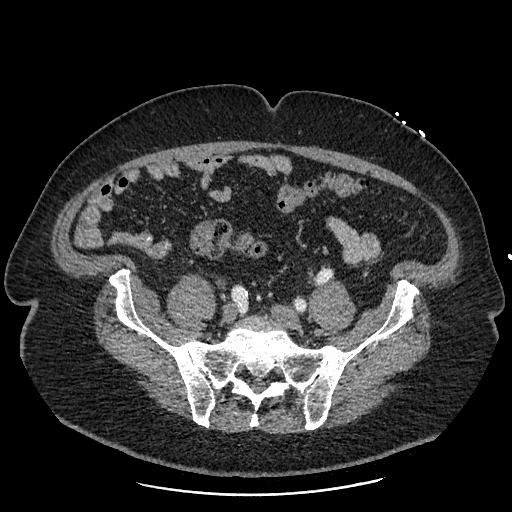
[im 388/808  soft-tissue]
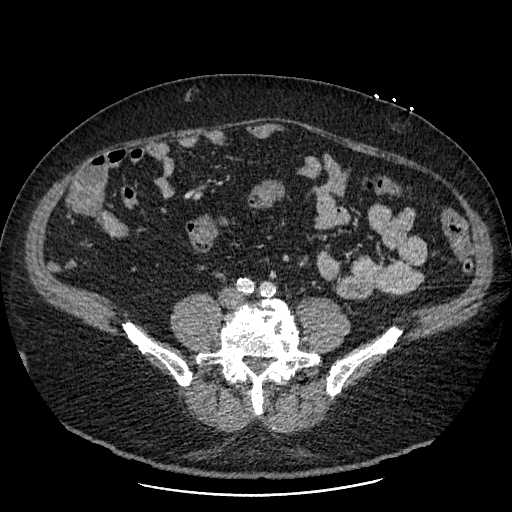
[im 420/808  soft-tissue]
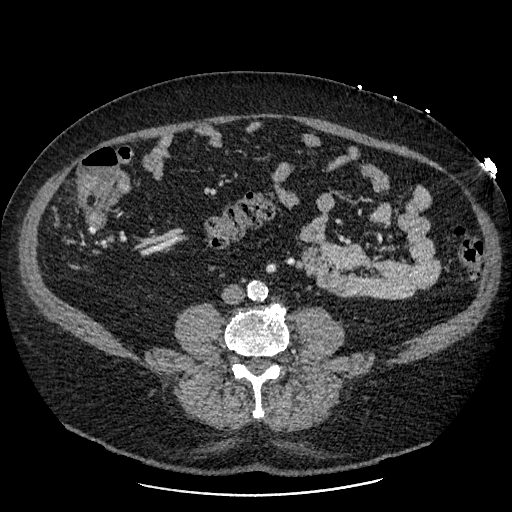
[im 485/808  soft-tissue]
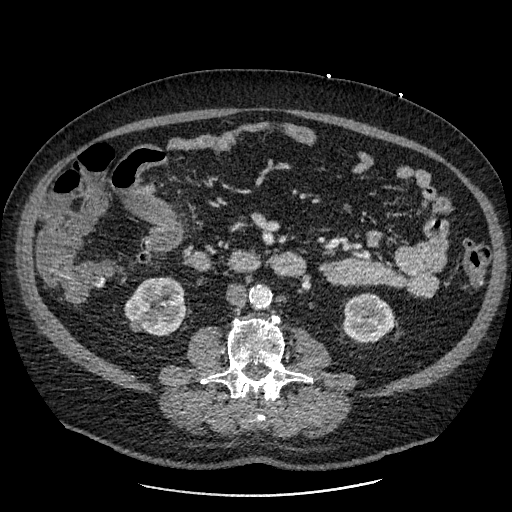
[im 485/808  bone]
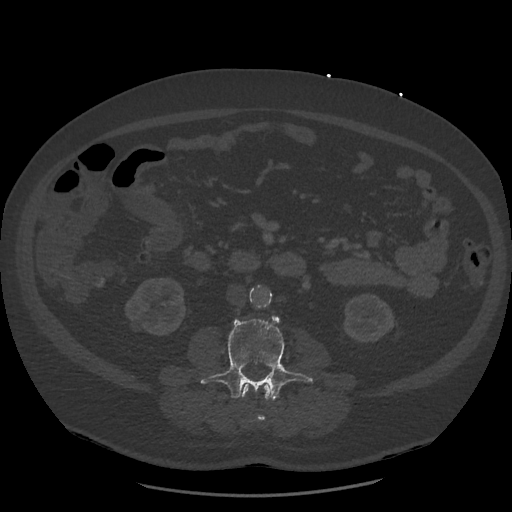
[im 549/808  soft-tissue]
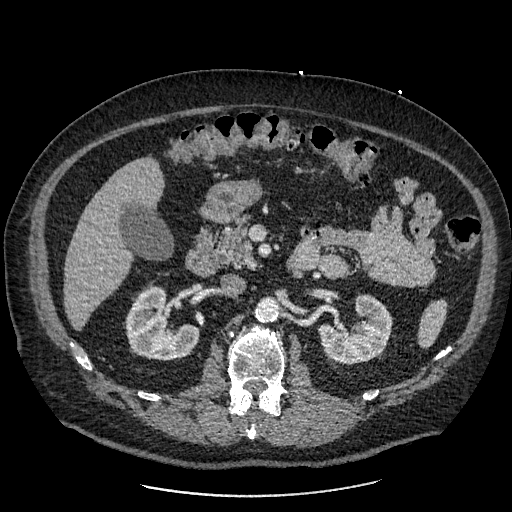
[im 614/808  soft-tissue]
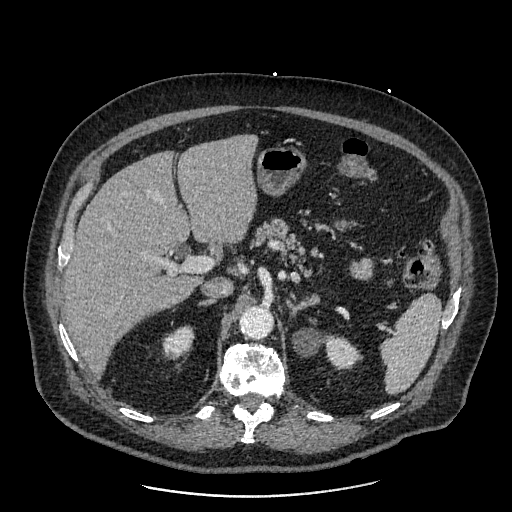
[im 646/808  soft-tissue]
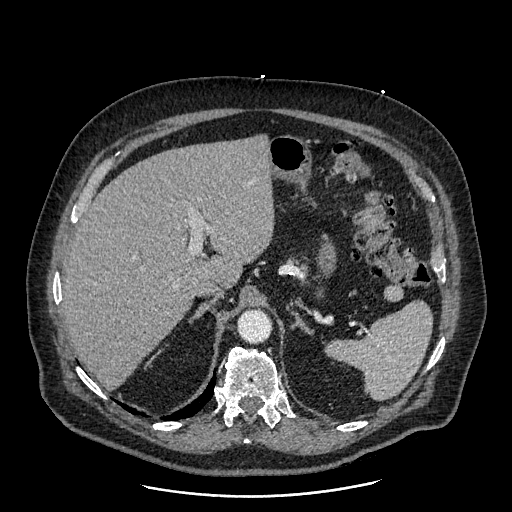
[im 711/808  soft-tissue]
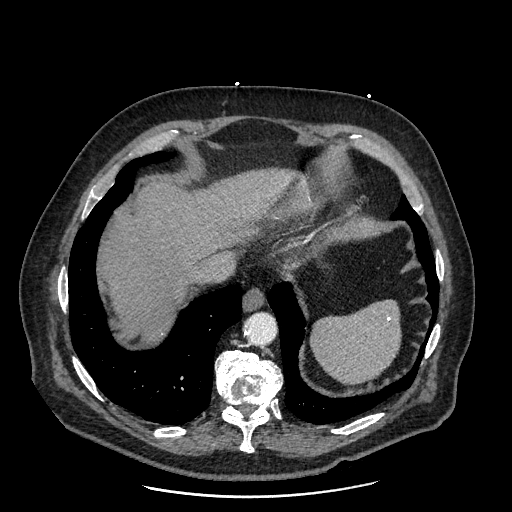
[im 775/808  soft-tissue]
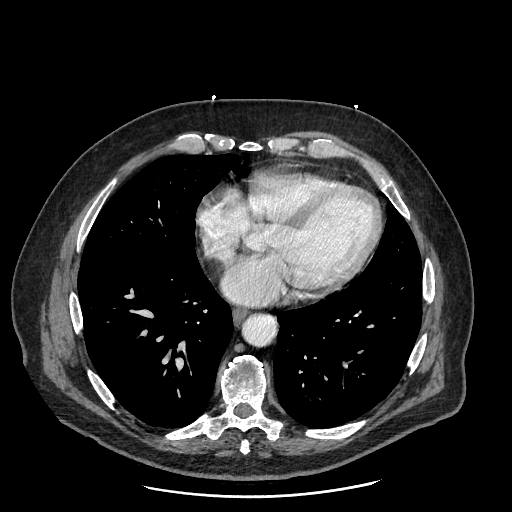

[Series 602: sag standard 2x2 · sagittal · 0.99mm/px · 3 of 227 slices shown]
[im 76/227  soft-tissue]
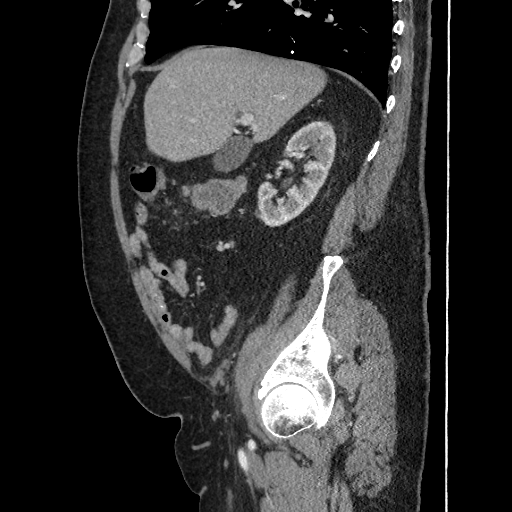
[im 101/227  soft-tissue]
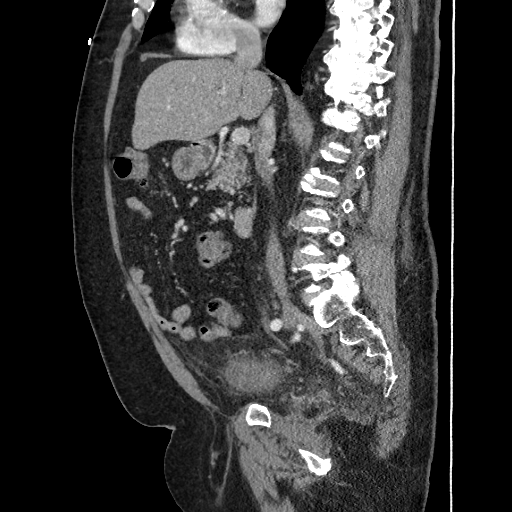
[im 126/227  soft-tissue]
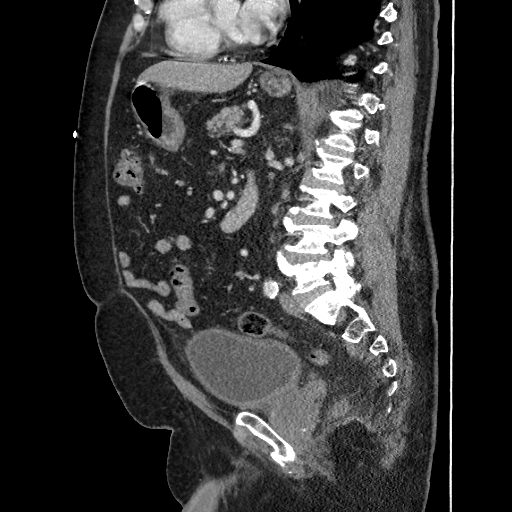

[17 of 46 positions shown; findings below may reference images not displayed]

FINDINGS: ABDOMEN: Imaged portions of the lung bases are clear. Liver, gallbladder, spleen, adrenals, pancreas, and kidneys are unremarkable. No hydronephrosis. No abdominal aneurysm or dissection.
Pelvis: There are scattered diverticula throughout the colon without findings of acute diverticulitis. Appendix normal. No abnormal bowel wall thickening of the colon or small bowel. No suspicious adenopathy or significant free fluid in the abdomen or pelvis.
Prostate is enlarged and measures 6 cm in transverse dimension. The bladder is partially distended with mild diffuse thickening of the bladder wall. There is faint stranding of the adjacent fat. No aggressive osseous lesions.
IMPRESSION: 1. Mild diffuse thickening of the bladder wall with faint stranding of the adjacent fat. Correlate if any clinical findings of cystitis.
2. Otherwise no acute findings in the abdomen or pelvis.
All CT scans at this facility use iterative reconstruction technique, dose modulation and/or weight based dosing when appropriate to reduce radiation dose to as low as reasonably achievable.

## 2023-11-07 IMAGING — CR XR CHEST 1 VIEW
1 series · 1 of 1 positions shown · non-contrast
Comparison: None provided.

FINAL REPORT:
EXAM:
CR Chest, 1  View.
CLINICAL HISTORY: dyspnea

[AP]
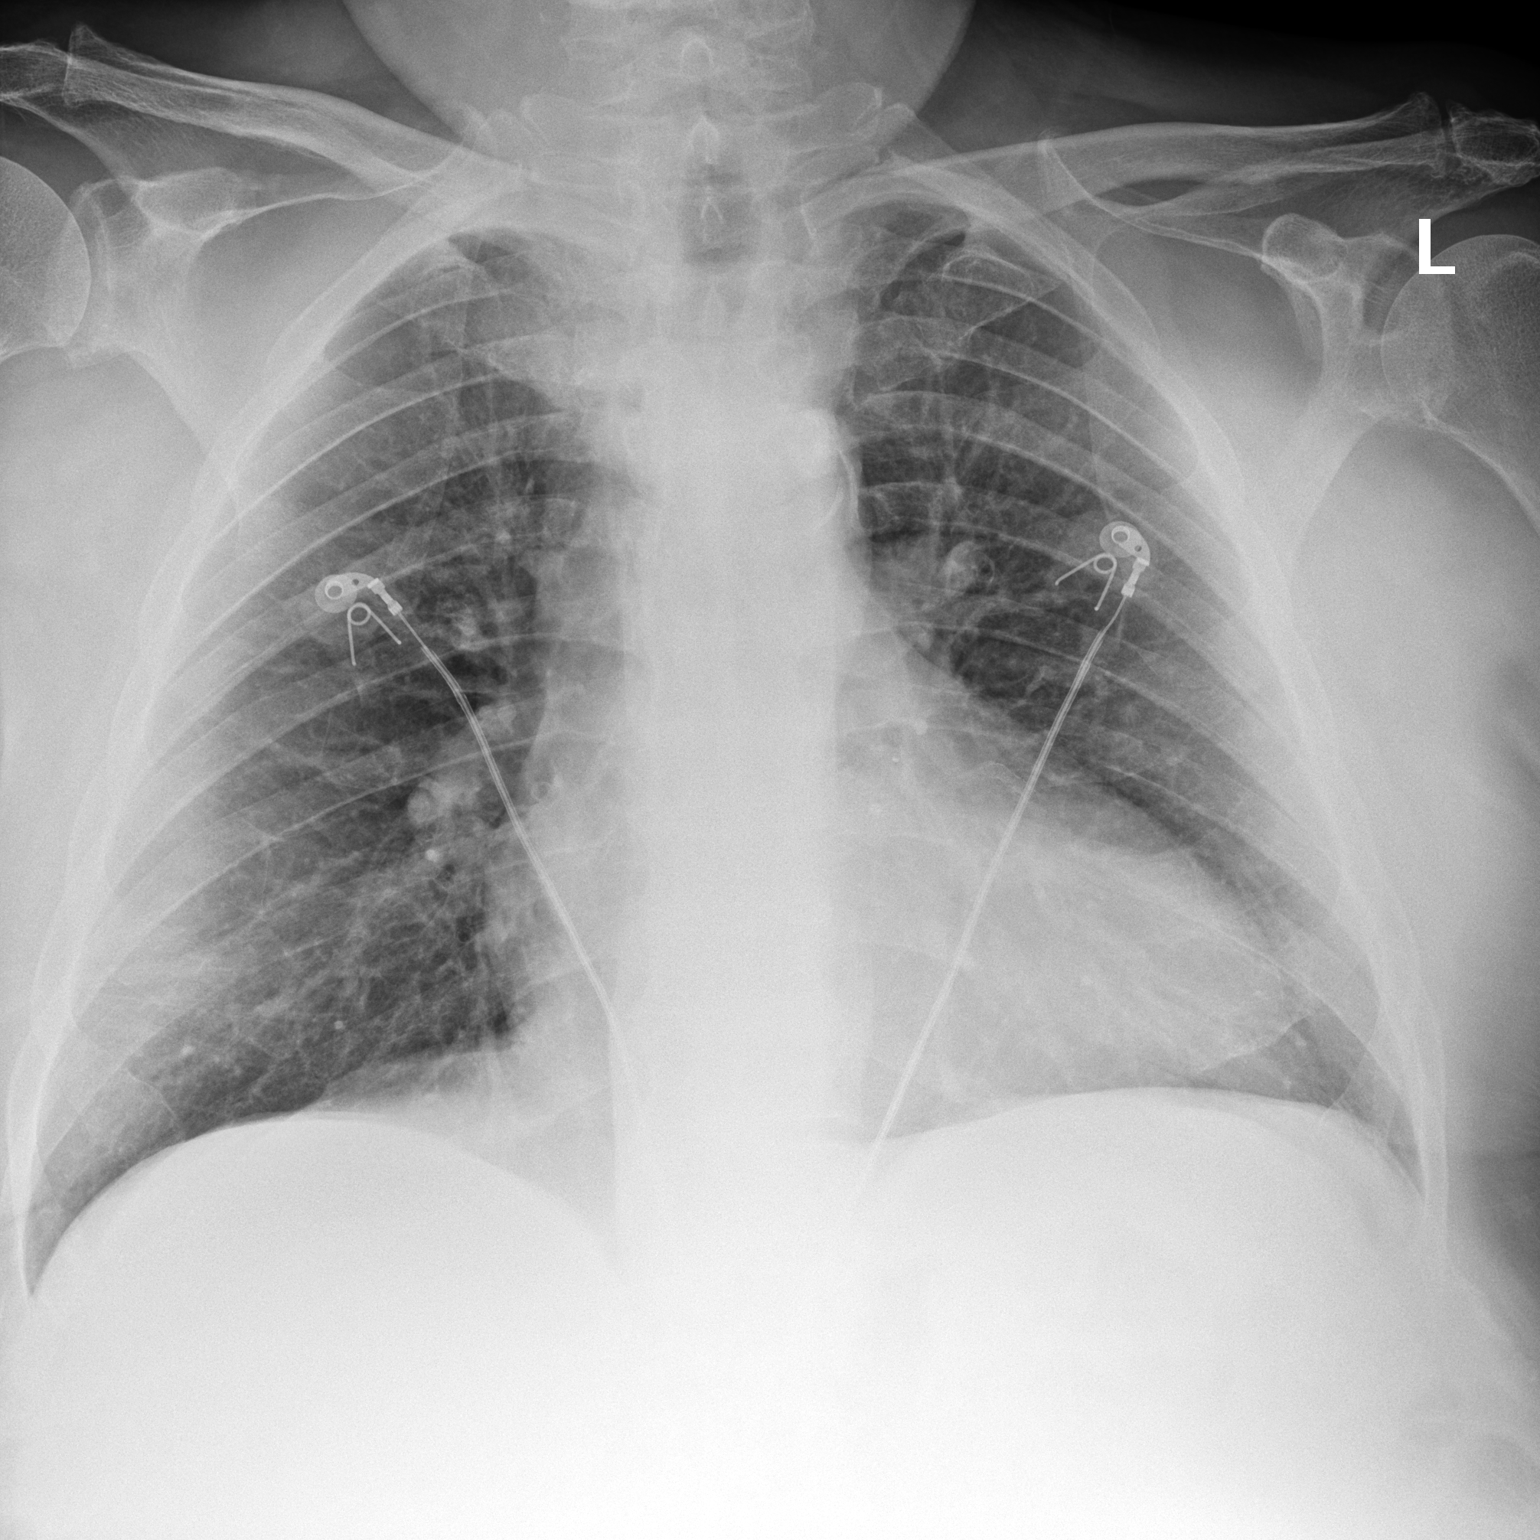

[1 of 1 positions shown; findings below may reference images not displayed]

FINDINGS: LUNGS:
No consolidation
PLEURAL SPACES:
No pleural effusion or pneumothorax
MEDIASTINUM:
Cardiac size and mediastinal contours within normal limits
BONES:
No acute osseous abnormality
IMPRESSION: No acute cardiopulmonary process

## 2023-12-26 IMAGING — CR XR CHEST 1 VIEW
1 series · 1 of 1 positions shown · non-contrast
Comparison: CR/SR - XR CHEST 1 VIEW - 11/07/23 [DATE] EDT

FINAL REPORT:
EXAM:
CR Chest, 1  View.
CLINICAL HISTORY: chest pain

[AP]
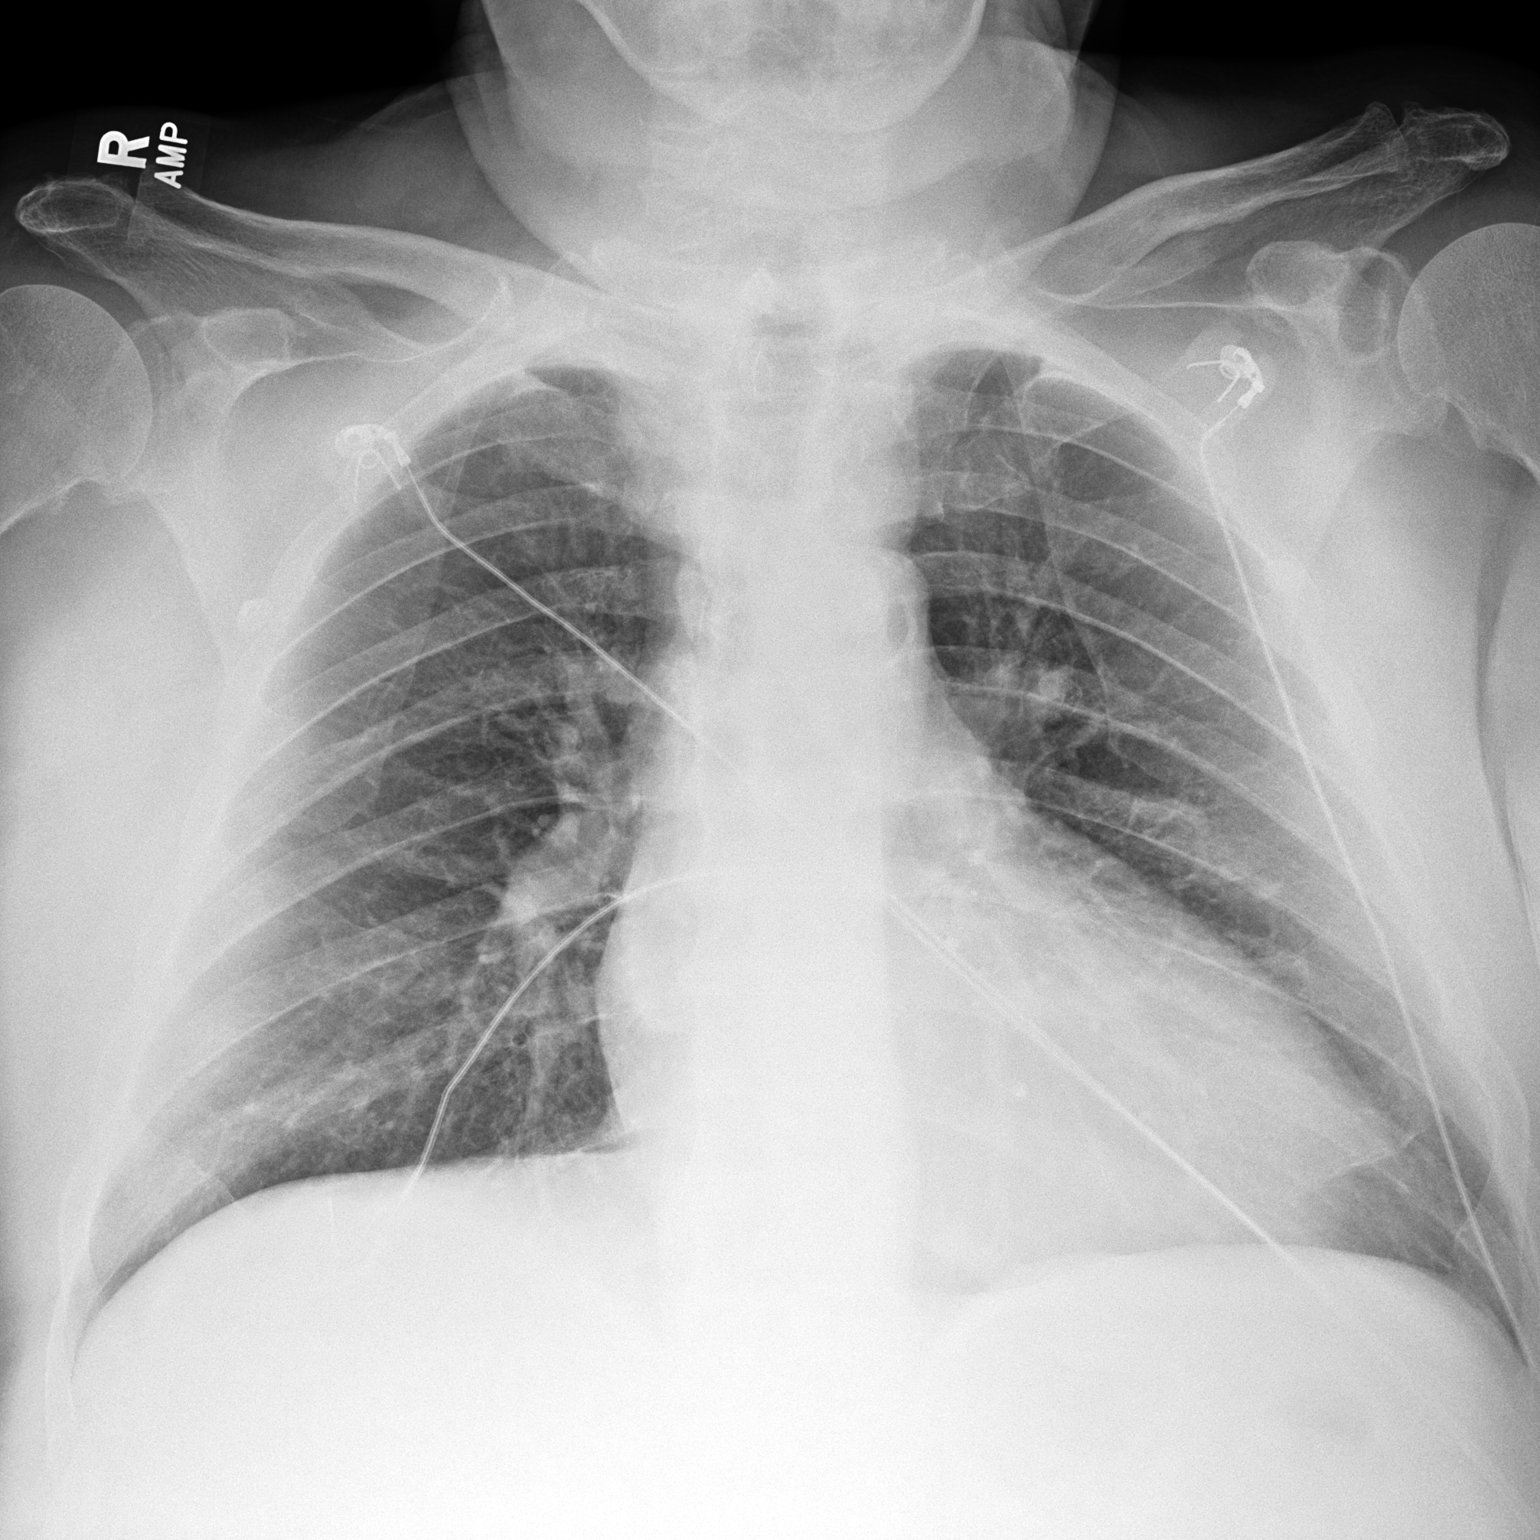

[1 of 1 positions shown; findings below may reference images not displayed]

FINDINGS: LUNGS:
The lungs show no infiltrate or other acute finding.
PLEURAL SPACES:
No evidence of pleural effusion or pneumothorax.
MEDIASTINUM:
Cardiac size and mediastinal contours within normal limits.
BONES:
No acute osseous abnormality.
IMPRESSION: No acute cardiopulmonary pathology is evident.
